# Patient Record
Sex: Female | Born: 1990 | Hispanic: Yes | Marital: Married | Smoking: Former smoker
Health system: Southern US, Community
[De-identification: ages and names within clinical notes are randomized; demographics above are authoritative.]

## PROBLEM LIST (undated history)

## (undated) DIAGNOSIS — Z789 Other specified health status: Secondary | ICD-10-CM

## (undated) DIAGNOSIS — O24419 Gestational diabetes mellitus in pregnancy, unspecified control: Secondary | ICD-10-CM

## (undated) DIAGNOSIS — O24410 Gestational diabetes mellitus in pregnancy, diet controlled: Secondary | ICD-10-CM

## (undated) HISTORY — DX: Gestational diabetes mellitus in pregnancy, unspecified control: O24.419

## (undated) HISTORY — DX: Gestational diabetes mellitus in pregnancy, diet controlled: O24.410

---
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
## 2011-01-02 ENCOUNTER — Other Ambulatory Visit (HOSPITAL_COMMUNITY): Payer: Self-pay | Admitting: Obstetrics

## 2011-01-02 DIAGNOSIS — Z3689 Encounter for other specified antenatal screening: Secondary | ICD-10-CM

## 2011-01-07 ENCOUNTER — Ambulatory Visit (HOSPITAL_COMMUNITY)
Admission: RE | Admit: 2011-01-07 | Discharge: 2011-01-07 | Disposition: A | Discharge status: Home / Self Care | Payer: Medicaid Other | Source: Ambulatory Visit | Attending: Obstetrics | Admitting: Obstetrics

## 2011-01-07 DIAGNOSIS — O358XX0 Maternal care for other (suspected) fetal abnormality and damage, not applicable or unspecified: Secondary | ICD-10-CM | POA: Insufficient documentation

## 2011-01-07 DIAGNOSIS — Z3689 Encounter for other specified antenatal screening: Secondary | ICD-10-CM

## 2011-01-07 DIAGNOSIS — Z363 Encounter for antenatal screening for malformations: Secondary | ICD-10-CM | POA: Insufficient documentation

## 2011-01-07 DIAGNOSIS — Z1389 Encounter for screening for other disorder: Secondary | ICD-10-CM | POA: Insufficient documentation

## 2011-04-27 ENCOUNTER — Encounter (HOSPITAL_COMMUNITY): Payer: Self-pay | Admitting: *Deleted

## 2011-04-27 ENCOUNTER — Inpatient Hospital Stay (HOSPITAL_COMMUNITY)
Admission: AD | Admit: 2011-04-27 | Discharge: 2011-05-02 | DRG: 765 | Disposition: A | Discharge status: Home / Self Care | Payer: Medicaid Other | Source: Ambulatory Visit | Attending: Obstetrics | Admitting: Obstetrics

## 2011-04-27 DIAGNOSIS — IMO0002 Reserved for concepts with insufficient information to code with codable children: Secondary | ICD-10-CM | POA: Diagnosis not present

## 2011-04-27 DIAGNOSIS — O139 Gestational [pregnancy-induced] hypertension without significant proteinuria, unspecified trimester: Secondary | ICD-10-CM | POA: Diagnosis present

## 2011-04-27 DIAGNOSIS — O324XX0 Maternal care for high head at term, not applicable or unspecified: Secondary | ICD-10-CM | POA: Diagnosis present

## 2011-04-27 DIAGNOSIS — O1410 Severe pre-eclampsia, unspecified trimester: Secondary | ICD-10-CM | POA: Diagnosis present

## 2011-04-27 LAB — COMPREHENSIVE METABOLIC PANEL
ALT: 10 U/L (ref 0–35)
Albumin: 2.2 g/dL — ABNORMAL LOW (ref 3.5–5.2)
Alkaline Phosphatase: 134 U/L — ABNORMAL HIGH (ref 39–117)
Glucose, Bld: 102 mg/dL — ABNORMAL HIGH (ref 70–99)
Potassium: 4.4 mEq/L (ref 3.5–5.1)
Sodium: 136 mEq/L (ref 135–145)
Total Protein: 5.8 g/dL — ABNORMAL LOW (ref 6.0–8.3)

## 2011-04-27 LAB — CBC
HCT: 32.8 % — ABNORMAL LOW (ref 36.0–46.0)
Platelets: 220 10*3/uL (ref 150–400)
RDW: 14.7 % (ref 11.5–15.5)
WBC: 10 10*3/uL (ref 4.0–10.5)

## 2011-04-27 MED ORDER — MAGNESIUM SULFATE BOLUS VIA INFUSION
4.0000 g | Freq: Once | INTRAVENOUS | Status: AC
  Administered 2011-04-27: 4 g via INTRAVENOUS
  Filled 2011-04-27: qty 500

## 2011-04-27 MED ORDER — PHENYLEPHRINE 40 MCG/ML (10ML) SYRINGE FOR IV PUSH (FOR BLOOD PRESSURE SUPPORT)
80.0000 ug | PREFILLED_SYRINGE | INTRAVENOUS | Status: DC | PRN
  Filled 2011-04-27: qty 5

## 2011-04-27 MED ORDER — OXYTOCIN 20 UNITS IN LACTATED RINGERS INFUSION - SIMPLE
1.0000 m[IU]/min | INTRAVENOUS | Status: DC
  Administered 2011-04-28: 2 m[IU]/min via INTRAVENOUS
  Administered 2011-04-29: 20 [IU] via INTRAVENOUS
  Administered 2011-04-29: 20 m[IU]/min via INTRAVENOUS
  Filled 2011-04-27 (×2): qty 1000

## 2011-04-27 MED ORDER — MISOPROSTOL 25 MCG QUARTER TABLET
25.0000 ug | ORAL_TABLET | ORAL | Status: DC | PRN
  Administered 2011-04-27 – 2011-04-28 (×3): 25 ug via VAGINAL
  Filled 2011-04-27 (×4): qty 0.25

## 2011-04-27 MED ORDER — ACETAMINOPHEN 325 MG PO TABS: 650.0000 mg | ORAL_TABLET | ORAL | Status: DC | PRN

## 2011-04-27 MED ORDER — LACTATED RINGERS IV SOLN
INTRAVENOUS | Status: DC
  Administered 2011-04-27 – 2011-04-29 (×4): via INTRAVENOUS

## 2011-04-27 MED ORDER — FENTANYL 2.5 MCG/ML BUPIVACAINE 1/10 % EPIDURAL INFUSION (WH - ANES)
14.0000 mL/h | INTRAMUSCULAR | Status: DC
  Administered 2011-04-29 (×3): 14 mL/h via EPIDURAL
  Filled 2011-04-27 (×4): qty 60

## 2011-04-27 MED ORDER — EPHEDRINE 5 MG/ML INJ: 10.0000 mg | INTRAVENOUS | Status: DC | PRN

## 2011-04-27 MED ORDER — PHENYLEPHRINE 40 MCG/ML (10ML) SYRINGE FOR IV PUSH (FOR BLOOD PRESSURE SUPPORT): 80.0000 ug | PREFILLED_SYRINGE | INTRAVENOUS | Status: DC | PRN

## 2011-04-27 MED ORDER — OXYCODONE-ACETAMINOPHEN 5-325 MG PO TABS: 2.0000 | ORAL_TABLET | ORAL | Status: DC | PRN

## 2011-04-27 MED ORDER — CITRIC ACID-SODIUM CITRATE 334-500 MG/5ML PO SOLN
30.0000 mL | ORAL | Status: DC | PRN
  Administered 2011-04-29: 30 mL via ORAL
  Filled 2011-04-27: qty 15

## 2011-04-27 MED ORDER — OXYTOCIN 20 UNITS IN LACTATED RINGERS INFUSION - SIMPLE: 125.0000 mL/h | Freq: Once | INTRAVENOUS | Status: DC

## 2011-04-27 MED ORDER — LACTATED RINGERS IV SOLN
500.0000 mL | INTRAVENOUS | Status: DC | PRN
  Administered 2011-04-28: 500 mL via INTRAVENOUS

## 2011-04-27 MED ORDER — IBUPROFEN 600 MG PO TABS: 600.0000 mg | ORAL_TABLET | Freq: Four times a day (QID) | ORAL | Status: DC | PRN

## 2011-04-27 MED ORDER — EPHEDRINE 5 MG/ML INJ
10.0000 mg | INTRAVENOUS | Status: DC | PRN
  Filled 2011-04-27: qty 4

## 2011-04-27 MED ORDER — TERBUTALINE SULFATE 1 MG/ML IJ SOLN: 0.2500 mg | Freq: Once | INTRAMUSCULAR | Status: AC | PRN

## 2011-04-27 MED ORDER — LACTATED RINGERS IV SOLN
500.0000 mL | Freq: Once | INTRAVENOUS | Status: AC
  Administered 2011-04-29 (×3): via INTRAVENOUS
  Administered 2011-04-29: 500 mL via INTRAVENOUS

## 2011-04-27 MED ORDER — DIPHENHYDRAMINE HCL 50 MG/ML IJ SOLN: 12.5000 mg | INTRAMUSCULAR | Status: DC | PRN

## 2011-04-27 MED ORDER — FLEET ENEMA 7-19 GM/118ML RE ENEM: 1.0000 | ENEMA | RECTAL | Status: DC | PRN

## 2011-04-27 MED ORDER — ONDANSETRON HCL 4 MG/2ML IJ SOLN: 4.0000 mg | Freq: Four times a day (QID) | INTRAMUSCULAR | Status: DC | PRN

## 2011-04-27 MED ORDER — MAGNESIUM SULFATE 40 G IN LACTATED RINGERS - SIMPLE
2.0000 g/h | INTRAVENOUS | Status: DC
  Administered 2011-04-28 – 2011-04-29 (×2): 2 g/h via INTRAVENOUS
  Filled 2011-04-27 (×3): qty 500

## 2011-04-27 MED ORDER — LIDOCAINE HCL (PF) 1 % IJ SOLN: 30.0000 mL | INTRAMUSCULAR | Status: DC | PRN

## 2011-04-27 MED ORDER — OXYTOCIN BOLUS FROM INFUSION
500.0000 mL | Freq: Once | INTRAVENOUS | Status: DC
  Filled 2011-04-27: qty 500

## 2011-04-27 NOTE — H&P (Signed)
This is Dr. Francoise Ceo dictating the preop the admitting history and physical on  Meredith [name] she's 20 year old gravida 1 EDC 12 04/27/2011 she is [redacted] weeks pregnant GBS negative and she was seen in the office today the blood pressure 100 him a 40/90-3+ proteinuria and a him a pulmonary in in one week cervix 1 cm long vertex -3 station she would was admitted for [facility] for delivery PIH labs done CBC and she was started on magnesium sulfate 4 g loading total ML and then Cytotec inserted every 4 hours x3 Past medical history negative Past surgical history negative Social history negative Physical exam well-developed female in no distress HEENT negative Breasts negative Heart regular rhythm no murmurs no gallops Abdomen term Pelvic as described above Extremities negative

## 2011-04-28 LAB — CBC
MCHC: 32.9 g/dL (ref 30.0–36.0)
RDW: 14.6 % (ref 11.5–15.5)

## 2011-04-28 NOTE — Progress Notes (Signed)
   blood pressure 150 1/95 She got Cytotec x3 last night and her cervix is now 2 cm 90% and the vertex at a -3 station amniotomy was performed the fluids clear an IUPC inserted she's now on low-dose Pitocin and having irregular contractions

## 2011-04-28 NOTE — Progress Notes (Signed)
Cervix is also centimeters and the vertex is at a 0 station patient plans to get her epidural tracing is reactive continue him present therapy

## 2011-04-28 NOTE — Progress Notes (Signed)
Cervix is now 5 cm 90% vertex -2 station contracting every 1-4 minutes on 12 milliunits of Pitocin tracing is reactive continue present therapy

## 2011-04-29 ENCOUNTER — Inpatient Hospital Stay (HOSPITAL_COMMUNITY): Payer: Medicaid Other | Admitting: Anesthesiology

## 2011-04-29 ENCOUNTER — Encounter (HOSPITAL_COMMUNITY): Payer: Self-pay | Admitting: Anesthesiology

## 2011-04-29 ENCOUNTER — Encounter (HOSPITAL_COMMUNITY): Payer: Self-pay | Admitting: *Deleted

## 2011-04-29 ENCOUNTER — Other Ambulatory Visit: Payer: Self-pay | Admitting: Obstetrics & Gynecology

## 2011-04-29 ENCOUNTER — Encounter (HOSPITAL_COMMUNITY)
Admission: AD | Disposition: A | Discharge status: Home / Self Care | Payer: Self-pay | Source: Ambulatory Visit | Attending: Obstetrics

## 2011-04-29 DIAGNOSIS — O1410 Severe pre-eclampsia, unspecified trimester: Secondary | ICD-10-CM | POA: Diagnosis present

## 2011-04-29 LAB — CBC
HCT: 31.7 % — ABNORMAL LOW (ref 36.0–46.0)
MCH: 26.9 pg (ref 26.0–34.0)
MCV: 82.1 fL (ref 78.0–100.0)
RDW: 14.8 % (ref 11.5–15.5)
WBC: 38.8 10*3/uL — ABNORMAL HIGH (ref 4.0–10.5)

## 2011-04-29 SURGERY — Surgical Case
Anesthesia: Epidural | Site: Uterus | Wound class: Clean Contaminated

## 2011-04-29 MED ORDER — MEASLES, MUMPS & RUBELLA VAC ~~LOC~~ INJ
0.5000 mL | INJECTION | Freq: Once | SUBCUTANEOUS | Status: AC
  Administered 2011-05-02: 0.5 mL via SUBCUTANEOUS
  Filled 2011-04-29 (×2): qty 0.5

## 2011-04-29 MED ORDER — LIDOCAINE-EPINEPHRINE (PF) 2 %-1:200000 IJ SOLN
INTRAMUSCULAR | Status: AC
Start: 2011-04-29 — End: 2011-04-29
  Filled 2011-04-29: qty 20

## 2011-04-29 MED ORDER — ZOLPIDEM TARTRATE 5 MG PO TABS: 5.0000 mg | ORAL_TABLET | Freq: Every evening | ORAL | Status: DC | PRN

## 2011-04-29 MED ORDER — TETANUS-DIPHTH-ACELL PERTUSSIS 5-2.5-18.5 LF-MCG/0.5 IM SUSP
0.5000 mL | Freq: Once | INTRAMUSCULAR | Status: DC
  Filled 2011-04-29: qty 0.5

## 2011-04-29 MED ORDER — LANOLIN HYDROUS EX OINT: 1.0000 "application " | TOPICAL_OINTMENT | CUTANEOUS | Status: DC | PRN

## 2011-04-29 MED ORDER — FENTANYL CITRATE 0.05 MG/ML IJ SOLN
INTRAMUSCULAR | Status: AC
  Filled 2011-04-29: qty 2

## 2011-04-29 MED ORDER — MAGNESIUM HYDROXIDE 400 MG/5ML PO SUSP: 30.0000 mL | ORAL | Status: DC | PRN

## 2011-04-29 MED ORDER — ONDANSETRON HCL 4 MG/2ML IJ SOLN
INTRAMUSCULAR | Status: DC | PRN
  Administered 2011-04-29: 4 mg via INTRAVENOUS

## 2011-04-29 MED ORDER — ONDANSETRON HCL 4 MG/2ML IJ SOLN
INTRAMUSCULAR | Status: AC
  Filled 2011-04-29: qty 2

## 2011-04-29 MED ORDER — DIBUCAINE 1 % RE OINT
1.0000 "application " | TOPICAL_OINTMENT | RECTAL | Status: DC | PRN
  Filled 2011-04-29: qty 28

## 2011-04-29 MED ORDER — SIMETHICONE 80 MG PO CHEW
80.0000 mg | CHEWABLE_TABLET | ORAL | Status: DC | PRN
  Administered 2011-04-30 – 2011-05-02 (×6): 80 mg via ORAL

## 2011-04-29 MED ORDER — OXYCODONE-ACETAMINOPHEN 5-325 MG PO TABS
1.0000 | ORAL_TABLET | ORAL | Status: DC | PRN
  Administered 2011-04-30 – 2011-05-01 (×3): 1 via ORAL
  Filled 2011-04-29: qty 2
  Filled 2011-04-29 (×3): qty 1

## 2011-04-29 MED ORDER — OXYTOCIN 20 UNITS IN LACTATED RINGERS INFUSION - SIMPLE
INTRAVENOUS | Status: AC
  Administered 2011-04-29: 20 [IU] via INTRAVENOUS
  Filled 2011-04-29: qty 1000

## 2011-04-29 MED ORDER — OXYTOCIN 20 UNITS IN LACTATED RINGERS INFUSION - SIMPLE
INTRAVENOUS | Status: DC | PRN
  Administered 2011-04-29: 20 [IU] via INTRAVENOUS

## 2011-04-29 MED ORDER — MEDROXYPROGESTERONE ACETATE 150 MG/ML IM SUSP: 150.0000 mg | INTRAMUSCULAR | Status: DC | PRN

## 2011-04-29 MED ORDER — ONDANSETRON HCL 4 MG/2ML IJ SOLN
4.0000 mg | INTRAMUSCULAR | Status: DC | PRN
  Administered 2011-04-29: 4 mg via INTRAVENOUS
  Filled 2011-04-29: qty 2

## 2011-04-29 MED ORDER — FENTANYL 2.5 MCG/ML BUPIVACAINE 1/10 % EPIDURAL INFUSION (WH - ANES)
INTRAMUSCULAR | Status: DC | PRN
  Administered 2011-04-29: 14 mL/h via EPIDURAL

## 2011-04-29 MED ORDER — FERROUS SULFATE 325 (65 FE) MG PO TABS
325.0000 mg | ORAL_TABLET | Freq: Two times a day (BID) | ORAL | Status: DC
  Administered 2011-04-30 – 2011-05-02 (×5): 325 mg via ORAL
  Filled 2011-04-29 (×6): qty 1

## 2011-04-29 MED ORDER — FENTANYL CITRATE 0.05 MG/ML IJ SOLN: 25.0000 ug | INTRAMUSCULAR | Status: DC | PRN

## 2011-04-29 MED ORDER — WITCH HAZEL-GLYCERIN EX PADS: 1.0000 "application " | MEDICATED_PAD | CUTANEOUS | Status: DC | PRN

## 2011-04-29 MED ORDER — CEFAZOLIN SODIUM-DEXTROSE 2-3 GM-% IV SOLR
2.0000 g | INTRAVENOUS | Status: AC
  Administered 2011-04-29: 2 g via INTRAVENOUS
  Filled 2011-04-29: qty 50

## 2011-04-29 MED ORDER — MORPHINE SULFATE 0.5 MG/ML IJ SOLN
INTRAMUSCULAR | Status: AC
  Filled 2011-04-29: qty 10

## 2011-04-29 MED ORDER — ONDANSETRON HCL 4 MG PO TABS: 4.0000 mg | ORAL_TABLET | ORAL | Status: DC | PRN

## 2011-04-29 MED ORDER — MORPHINE SULFATE (PF) 0.5 MG/ML IJ SOLN
INTRAMUSCULAR | Status: DC | PRN
  Administered 2011-04-29: 1 ug via INTRAVENOUS
  Administered 2011-04-29 (×2): .5 ug via EPIDURAL

## 2011-04-29 MED ORDER — LIDOCAINE HCL 1.5 % IJ SOLN
INTRAMUSCULAR | Status: DC | PRN
  Administered 2011-04-29 (×2): 5 mL via EPIDURAL

## 2011-04-29 MED ORDER — ACETAMINOPHEN 500 MG PO TABS
1000.0000 mg | ORAL_TABLET | Freq: Once | ORAL | Status: AC
  Administered 2011-04-29: 1000 mg via ORAL

## 2011-04-29 MED ORDER — SENNOSIDES-DOCUSATE SODIUM 8.6-50 MG PO TABS
2.0000 | ORAL_TABLET | Freq: Every day | ORAL | Status: DC
  Administered 2011-04-30: 1 via ORAL
  Administered 2011-05-01: 2 via ORAL

## 2011-04-29 MED ORDER — SODIUM BICARBONATE 8.4 % IV SOLN
INTRAVENOUS | Status: AC
Start: 2011-04-29 — End: 2011-04-29
  Filled 2011-04-29: qty 50

## 2011-04-29 MED ORDER — SODIUM BICARBONATE 8.4 % IV SOLN
INTRAVENOUS | Status: DC | PRN
  Administered 2011-04-29: 5 mL via EPIDURAL

## 2011-04-29 MED ORDER — PRENATAL PLUS 27-1 MG PO TABS
1.0000 | ORAL_TABLET | Freq: Every day | ORAL | Status: DC
  Administered 2011-04-30 – 2011-05-02 (×3): 1 via ORAL
  Filled 2011-04-29 (×2): qty 1

## 2011-04-29 MED ORDER — IBUPROFEN 600 MG PO TABS
600.0000 mg | ORAL_TABLET | Freq: Four times a day (QID) | ORAL | Status: DC
  Administered 2011-04-30 – 2011-05-02 (×10): 600 mg via ORAL
  Filled 2011-04-29 (×5): qty 1

## 2011-04-29 MED ORDER — LACTATED RINGERS IV SOLN
INTRAVENOUS | Status: DC
  Administered 2011-04-29 – 2011-04-30 (×2): 125 mL/h via INTRAVENOUS

## 2011-04-29 MED ORDER — DIPHENHYDRAMINE HCL 25 MG PO CAPS: 25.0000 mg | ORAL_CAPSULE | Freq: Four times a day (QID) | ORAL | Status: DC | PRN

## 2011-04-29 MED ORDER — MORPHINE SULFATE (PF) 0.5 MG/ML IJ SOLN
INTRAMUSCULAR | Status: DC | PRN
  Administered 2011-04-29: 3 ug via EPIDURAL

## 2011-04-29 MED ORDER — LACTATED RINGERS IV SOLN
INTRAVENOUS | Status: DC | PRN
  Administered 2011-04-29: 12:00:00 via INTRAVENOUS

## 2011-04-29 SURGICAL SUPPLY — 31 items
BENZOIN TINCTURE PRP APPL 2/3 (GAUZE/BANDAGES/DRESSINGS) ×2 IMPLANT
CHLORAPREP W/TINT 26ML (MISCELLANEOUS) ×2 IMPLANT
CLOTH BEACON ORANGE TIMEOUT ST (SAFETY) ×2 IMPLANT
DRESSING TELFA 8X3 (GAUZE/BANDAGES/DRESSINGS) ×2 IMPLANT
ELECT REM PT RETURN 9FT ADLT (ELECTROSURGICAL) ×2
ELECTRODE REM PT RTRN 9FT ADLT (ELECTROSURGICAL) ×1 IMPLANT
GAUZE SPONGE 4X4 12PLY STRL LF (GAUZE/BANDAGES/DRESSINGS) ×4 IMPLANT
GLOVE BIO SURGEON STRL SZ 6.5 (GLOVE) ×4 IMPLANT
GOWN PREVENTION PLUS LG XLONG (DISPOSABLE) ×6 IMPLANT
NS IRRIG 1000ML POUR BTL (IV SOLUTION) ×2 IMPLANT
PACK C SECTION WH (CUSTOM PROCEDURE TRAY) ×2 IMPLANT
PAD ABD 7.5X8 STRL (GAUZE/BANDAGES/DRESSINGS) ×2 IMPLANT
SLEEVE SCD COMPRESS KNEE LRG (MISCELLANEOUS) IMPLANT
SLEEVE SCD COMPRESS KNEE MED (MISCELLANEOUS) ×2 IMPLANT
STAPLER VISISTAT 35W (STAPLE) IMPLANT
STRIP CLOSURE SKIN 1/2X4 (GAUZE/BANDAGES/DRESSINGS) ×2 IMPLANT
SUT MNCRL 0 VIOLET CTX 36 (SUTURE) ×2 IMPLANT
SUT MNCRL AB 3-0 PS2 27 (SUTURE) ×2 IMPLANT
SUT MONOCRYL 0 CTX 36 (SUTURE) ×2
SUT PDS AB 0 CTX 36 PDP370T (SUTURE) ×2 IMPLANT
SUT PLAIN 0 NONE (SUTURE) IMPLANT
SUT VIC AB 0 CT1 27 (SUTURE) ×2
SUT VIC AB 0 CT1 27XBRD ANBCTR (SUTURE) ×2 IMPLANT
SUT VIC AB 2-0 CT1 (SUTURE) IMPLANT
SUT VIC AB 2-0 CT1 27 (SUTURE) ×1
SUT VIC AB 2-0 CT1 TAPERPNT 27 (SUTURE) ×1 IMPLANT
SUT VIC AB 2-0 SH 27 (SUTURE)
SUT VIC AB 2-0 SH 27XBRD (SUTURE) IMPLANT
TOWEL OR 17X24 6PK STRL BLUE (TOWEL DISPOSABLE) ×4 IMPLANT
TRAY FOLEY CATH 14FR (SET/KITS/TRAYS/PACK) ×2 IMPLANT
WATER STERILE IRR 1000ML POUR (IV SOLUTION) ×2 IMPLANT

## 2011-04-29 NOTE — Progress Notes (Signed)
Late note - Foley removed at 0950 with 150 ml of yellow urine.

## 2011-04-29 NOTE — Progress Notes (Signed)
UR chart review completed.  

## 2011-04-29 NOTE — Progress Notes (Signed)
RN remains at the OR table - anesthesia testing for numbness.

## 2011-04-29 NOTE — Anesthesia Postprocedure Evaluation (Signed)
Anesthesia Post Note  Patient: Meredith [name]  Procedure(s) Performed:  CESAREAN SECTION - Primary Cesarian Section  Anesthesia type: Epidural  Patient location: PACU  Post pain: Pain level controlled  Post assessment: Post-op Vital signs reviewed  Last Vitals:  Filed Vitals:   04/29/11 1230  BP: 132/63  Pulse: 98  Temp: 36.7 C  Resp: 28    Post vital signs: stable  Level of consciousness: awake  Complications: No apparent anesthesia complications

## 2011-04-29 NOTE — Transfer of Care (Signed)
Immediate Anesthesia Transfer of Care Note  Patient: Meredith [name]  Procedure(s) Performed:  CESAREAN SECTION - Primary Cesarian Section  Patient Location: PACU  Anesthesia Type: Epidural  Level of Consciousness: awake and alert   Airway & Oxygen Therapy: Patient Spontanous Breathing  Post-op Assessment: Report given to PACU RN and Post -op Vital signs reviewed and stable  Post vital signs: Reviewed and stable  Complications: No apparent anesthesia complications

## 2011-04-29 NOTE — Progress Notes (Signed)
FHR check - pt. On OR table - RN at the bedside to maintain continuous fetal tracing.

## 2011-04-29 NOTE — Progress Notes (Signed)
Attempted to push x 2 uterine contractions. T. Does not feel urge to push or pressure. Will allow to labor down

## 2011-04-29 NOTE — Progress Notes (Addendum)
Meredith [name] is [name] 20 y.o. G1P0 at [redacted]w[redacted]d by LMP admitted for IOL  Subjective: Uncomfortable  Objective: BP 144/70  Pulse 117  Temp(Src) 99.6 F (37.6 C) (Axillary)  Resp 20  Ht 5\' 3"  (1.6 m)  Wt 109.317 kg (241 lb)  BMI 42.69 kg/m2  SpO2 99%  LMP 07/23/2010 I/O last 3 completed shifts: In: 7100.9 [P.O.:1450; I.V.:5650.9] Out: 2775 [Urine:2775]    FHT:  FHR: 150 bpm, variability: moderate,  accelerations:  Present,  decelerations:  Absent UC:   irregular, every 4 minutes SVE:   Dilation: 10 Effacement (%): 100 Station: +1 Exam by:: Dr. Tamela [name]  Labs: Lab Results  Component Value Date   WBC 22.9* 04/28/2011   HGB 11.8* 04/28/2011   HCT 35.9* 04/28/2011   MCV 81.4 04/28/2011   PLT 214 04/28/2011    Assessment / Plan: Arrest of decent, Persistent OP likely  Labor: See above Preeclampsia:  n/[name] Fetal Wellbeing:  Category I Pain Control:  Epidural I/D:  Febrile--no other signs/symptoms of chorioamnionitis Anticipated MOD:  Cesarean delivery  Meredith [name],Meredith [name] 04/29/2011, 10:52 AM  Pt is undergoing IOL secondary to preeclampsia.  BPs  Stable.

## 2011-04-29 NOTE — Progress Notes (Addendum)
Consent for C/S discussed and signed.  C/S called.

## 2011-04-29 NOTE — Progress Notes (Signed)
Start pushing - after an hour call and give update.

## 2011-04-29 NOTE — Progress Notes (Signed)
During a contraction.

## 2011-04-29 NOTE — Anesthesia Preprocedure Evaluation (Addendum)
Anesthesia Evaluation  Patient identified by MRN, date of birth, ID band Patient awake    Reviewed: Allergy & Precautions, H&P , NPO status , Patient's Chart, lab work & pertinent test results  Airway Mallampati: III TM Distance: >3 FB Neck ROM: full    Dental No notable dental hx.    Pulmonary neg pulmonary ROS,    Pulmonary exam normal       Cardiovascular neg cardio ROS     Neuro/Psych Negative Neurological ROS  Negative Psych ROS   GI/Hepatic negative GI ROS, Neg liver ROS,   Endo/Other  Morbid obesity  Renal/GU negative Renal ROS  Genitourinary negative   Musculoskeletal negative musculoskeletal ROS (+)   Abdominal (+) obese,   Peds negative pediatric ROS (+)  Hematology negative hematology ROS (+)   Anesthesia Other Findings   Reproductive/Obstetrics (+) Pregnancy                           Anesthesia Physical Anesthesia Plan  ASA: III  Anesthesia Plan: Epidural   Post-op Pain Management:    Induction:   Airway Management Planned:   Additional Equipment:   Intra-op Plan:   Post-operative Plan:   Informed Consent: I have reviewed the patients History and Physical, chart, labs and discussed the procedure including the risks, benefits and alternatives for the proposed anesthesia with the patient or authorized representative who has indicated his/her understanding and acceptance.     Plan Discussed with:   Anesthesia Plan Comments:         Anesthesia Quick Evaluation

## 2011-04-29 NOTE — Progress Notes (Signed)
FHR on the OR table 133 bpm.

## 2011-04-29 NOTE — Op Note (Signed)
Cesarean Section Procedure Note   [name]   04/27/2011 - 04/29/2011  Indications: Dystocia   Pre-operative Diagnosis: Arrest of Descent.   Post-operative Diagnosis: Same   Surgeon: Antionette Char A  Assistants: none  Anesthesia: epidural  Procedure Details:  The patient was seen in the Holding Room. The risks, benefits, complications, treatment options, and expected outcomes were discussed with the patient. The patient concurred with the proposed plan, giving informed consent. The patient was identified as Meredith [name] and the procedure verified as C-Section Delivery. A Time Out was held and the above information confirmed.  After induction of anesthesia, the patient was draped and prepped in the usual sterile manner. A transverse incision was made and carried down through the subcutaneous tissue to the fascia. The fascial incision was made and extended transversely. The fascia was separated from the underlying rectus tissue superiorly and inferiorly. The peritoneum was identified and entered. The peritoneal incision was extended longitudinally. The utero-vesical peritoneal reflection was incised transversely and the bladder flap was bluntly freed from the lower uterine segment. A low transverse uterine incision was made. Delivered from cephalic presentation was a  living newborn female infant. A cord ph was not sent. The umbilical cord was clamped and cut cord. A sample was obtained for evaluation. The placenta was removed Intact and appeared normal.  The uterine incision was closed with running locked sutures of 1-0 Monocryl. A second imbricating layer of the same suture was placed.  Hemostasis was observed. The paracolic gutters were irrigated. The fascia was then reapproximated with running sutures of 1-0Vicryl. The subcuticular closure was performed using 3-0 Monocryl.  Instrument, sponge, and needle counts were correct prior the abdominal closure and were correct at the  conclusion of the case.    Findings:  Edema of subcutaneous tissues, LUS   Estimated Blood Loss: 800 ml  Total IV Fluids:  per Anesthesiology  Urine Output: per Anesthesiology  Specimens: to Pathology   Complications: no complications  Disposition: PACU - hemodynamically stable.  Maternal Condition: stable   Baby condition / location:  nursery-stable    Signed: Surgeon(s): Roseanna Rainbow, MD

## 2011-04-29 NOTE — Anesthesia Procedure Notes (Signed)
Epidural Patient location during procedure: OB Start time: 04/29/2011 12:31 AM End time: 04/29/2011 12:37 AM Reason for block: procedure for pain  Staffing Anesthesiologist: Sandrea Hughs Performed by: anesthesiologist   Preanesthetic Checklist Completed: patient identified, site marked, surgical consent, pre-op evaluation, timeout performed, IV checked, risks and benefits discussed and monitors and equipment checked  Epidural Patient position: sitting Prep: site prepped and draped and DuraPrep Patient monitoring: continuous pulse ox and blood pressure Approach: midline Injection technique: LOR air  Needle:  Needle type: Tuohy  Needle gauge: 17 G Needle length: 9 cm Needle insertion depth: 7 cm Catheter type: closed end flexible Catheter size: 19 Gauge Catheter at skin depth: 12 cm Test dose: negative and 1.5% lidocaine  Assessment Sensory level: T9 Events: blood not aspirated, injection not painful, no injection resistance, negative IV test and no paresthesia

## 2011-04-30 MED ORDER — SCOPOLAMINE 1 MG/3DAYS TD PT72
1.0000 | MEDICATED_PATCH | Freq: Once | TRANSDERMAL | Status: DC
  Filled 2011-04-30: qty 1

## 2011-04-30 MED ORDER — NALOXONE HCL 0.4 MG/ML IJ SOLN: 1.0000 ug/kg/h | INTRAMUSCULAR | Status: DC | PRN

## 2011-04-30 MED ORDER — KETOROLAC TROMETHAMINE 60 MG/2ML IM SOLN: 60.0000 mg | Freq: Once | INTRAMUSCULAR | Status: AC | PRN

## 2011-04-30 MED ORDER — MEPERIDINE HCL 25 MG/ML IJ SOLN: 6.2500 mg | INTRAMUSCULAR | Status: DC | PRN

## 2011-04-30 MED ORDER — KETOROLAC TROMETHAMINE 30 MG/ML IJ SOLN: 30.0000 mg | Freq: Four times a day (QID) | INTRAMUSCULAR | Status: AC | PRN

## 2011-04-30 MED ORDER — NALOXONE HCL 0.4 MG/ML IJ SOLN: 0.4000 mg | INTRAMUSCULAR | Status: DC | PRN

## 2011-04-30 MED ORDER — DIPHENHYDRAMINE HCL 50 MG/ML IJ SOLN: 25.0000 mg | INTRAMUSCULAR | Status: DC | PRN

## 2011-04-30 MED ORDER — DIPHENHYDRAMINE HCL 50 MG/ML IJ SOLN: 12.5000 mg | INTRAMUSCULAR | Status: DC | PRN

## 2011-04-30 MED ORDER — ONDANSETRON HCL 4 MG/2ML IJ SOLN: 4.0000 mg | Freq: Three times a day (TID) | INTRAMUSCULAR | Status: DC | PRN

## 2011-04-30 MED ORDER — SODIUM CHLORIDE 0.9 % IJ SOLN: 3.0000 mL | INTRAMUSCULAR | Status: DC | PRN

## 2011-04-30 MED ORDER — DIPHENHYDRAMINE HCL 25 MG PO CAPS: 25.0000 mg | ORAL_CAPSULE | ORAL | Status: DC | PRN

## 2011-04-30 MED ORDER — SODIUM CHLORIDE 0.9 % IJ SOLN
3.0000 mL | Freq: Two times a day (BID) | INTRAMUSCULAR | Status: DC
  Administered 2011-04-30: 3 mL via INTRAVENOUS

## 2011-04-30 MED ORDER — IBUPROFEN 600 MG PO TABS
600.0000 mg | ORAL_TABLET | Freq: Four times a day (QID) | ORAL | Status: DC | PRN
  Filled 2011-04-30 (×6): qty 1

## 2011-04-30 MED ORDER — NALBUPHINE HCL 10 MG/ML IJ SOLN: 5.0000 mg | INTRAMUSCULAR | Status: DC | PRN

## 2011-04-30 MED ORDER — METOCLOPRAMIDE HCL 5 MG/ML IJ SOLN: 10.0000 mg | Freq: Three times a day (TID) | INTRAMUSCULAR | Status: DC | PRN

## 2011-04-30 MED ORDER — INFLUENZA VIRUS VACC SPLIT PF IM SUSP
0.5000 mL | INTRAMUSCULAR | Status: AC
  Filled 2011-04-30: qty 0.5

## 2011-04-30 NOTE — Addendum Note (Signed)
Addendum  created 04/30/11 1610 by Suella Grove   Modules edited:Notes Section

## 2011-04-30 NOTE — Progress Notes (Signed)
  Subjective: POD# 1 s/p Cesarean Delivery.  Indications: failure to progress  RH status/Rubella reviewed. Feeding: unknown Patient reports tolerating PO.  Denies HA/SOB/C/P/N/V/dizziness.  Reports flatus or BM. Breast symptoms: no.  She reports vaginal bleeding as normal, without clots.  She is ambulating, urinating without difficulty.     Objective: Vital signs in last 24 hours: BP 125/76  Pulse 103  Temp(Src) 99 F (37.2 C) (Oral)  Resp 20  Ht 5\' 3"  (1.6 m)  Wt 109.317 kg (241 lb)  BMI 42.69 kg/m2  SpO2 95%  LMP 07/23/2010  Breastfeeding? Unknown   Total I/O In: 1110 [P.O.:360; I.V.:750] Out: 450 [Urine:450]   Physical Exam:  General: alert CV: Regular rate and rhythm Resp: clear Abdomen: soft, nontender, normal bowel sounds Lochia: minimal Uterine Fundus: firm, below umbilicus, nontender Incision: dressing C/D Ext: extremities normal, atraumatic, no cyanosis or edema    Basename 04/30/11 0510 04/29/11 1427 04/28/11 2335  HGB 8.7* 10.4* --  HCT -- 31.7* 35.9*      Assessment/Plan: 20 y.o.  status post Cesarean section. POD# 1.   Doing well, stable.   PIH stable            Advance diet as tolerated Start po pain meds D/C foley  HLIV  Ambulate IS D/C MgSO4 Routine post-op care  JACKSON-MOORE,Chidera Dearcos A 04/30/2011, 12:50 PM

## 2011-04-30 NOTE — Anesthesia Postprocedure Evaluation (Signed)
  Anesthesia Post-op Note  Patient: Meredith [name]  Procedure(s) Performed:  CESAREAN SECTION - Primary Cesarian Section  Patient Location: Mother/Baby  Anesthesia Type: Epidural  Level of Consciousness: awake, alert  and oriented  Airway and Oxygen Therapy: Patient Spontanous Breathing  Post-op Pain: none  Post-op Assessment: Post-op Vital signs reviewed and Patient's Cardiovascular Status Stable  Post-op Vital Signs: Reviewed and stable  Complications: No apparent anesthesia complications

## 2011-05-01 LAB — CBC
Hemoglobin: 7.8 g/dL — ABNORMAL LOW (ref 12.0–15.0)
MCH: 27 pg (ref 26.0–34.0)
MCV: 83.4 fL (ref 78.0–100.0)
Platelets: 181 10*3/uL (ref 150–400)
RBC: 2.89 MIL/uL — ABNORMAL LOW (ref 3.87–5.11)

## 2011-05-01 MED ORDER — TETANUS-DIPHTH-ACELL PERTUSSIS 5-2.5-18.5 LF-MCG/0.5 IM SUSP
0.5000 mL | Freq: Once | INTRAMUSCULAR | Status: AC
  Administered 2011-05-01: 0.5 mL via INTRAMUSCULAR
  Filled 2011-05-01: qty 0.5

## 2011-05-02 DIAGNOSIS — IMO0002 Reserved for concepts with insufficient information to code with codable children: Secondary | ICD-10-CM | POA: Diagnosis not present

## 2011-05-02 MED ORDER — OXYCODONE-ACETAMINOPHEN 5-325 MG PO TABS: 1.0000 | ORAL_TABLET | ORAL | Status: DC | PRN

## 2011-05-02 MED ORDER — IBUPROFEN 600 MG PO TABS: 600.0000 mg | ORAL_TABLET | Freq: Four times a day (QID) | ORAL | Status: DC

## 2011-05-02 MED ORDER — FERROUS SULFATE 325 (65 FE) MG PO TABS: 325.0000 mg | ORAL_TABLET | Freq: Two times a day (BID) | ORAL | Status: DC

## 2011-05-02 NOTE — Discharge Summary (Signed)
Obstetric Discharge Summary Reason for Admission: induction of labor Prenatal Procedures: none Intrapartum Procedures: cesarean: low cervical, transverse Postpartum Procedures: none Complications-Operative and Postpartum: none Hemoglobin  Date Value Range Status  05/01/2011 7.8* 12.0-15.0 (g/dL) Final     HCT  Date Value Range Status  05/01/2011 24.1* 36.0-46.0 (%) Final    Discharge Diagnoses: Term Pregnancy-delivered, Preelampsia and Anemia complicating puerperium  Discharge Information: Date: 05/02/2011 Activity: pelvic rest Diet: routine Medications: PNV, Ibuprofen, Iron and Percocet Condition: stable Instructions: see above Discharge to: home Follow-up Information    Follow up with MARSHALL,BERNARD A, MD. Make an appointment in 2 days.   Contact information:   [address] [place] [address] [phone]          Newborn Data: Live born female  Birth Weight: 7 lb 1.9 oz (3230 g) APGAR: 8, 9  Home with mother.  [name],Meredith Weatherman A 05/02/2011, 1:54 PM

## 2011-05-02 NOTE — Progress Notes (Signed)
Subjective: POD #3 s/p LTC/S  Indication:failure to progress Patient reports tolerating PO.  Denies HA/SOB/C/P/N/V/dizziness.  Reports flatus or BM. Breast symptoms: no  She reports vaginal bleeding as normal, without clots.  She is ambulating, urinating without difficulty.     Objective: Vital signs in last 24 hours: BP 145/94  Pulse 85  Temp(Src) 98.5 F (36.9 C) (Oral)  Resp 18  Ht 5\' 3"  (1.6 m)  Wt 109.317 kg (241 lb)  BMI 42.69 kg/m2  SpO2 97%  LMP 07/23/2010  Breastfeeding? Unknown  Physical Exam:  General: alert CV: Regular rate and rhythm Resp: clear Abdomen: soft, nontender, normal bowel sounds Lochia: minimal Uterine Fundus: firm, below umbilicus, nontender Incision: clean, dry and intact Ext: extremities normal, atraumatic, no cyanosis or edema  Basename 05/01/11 0550 04/30/11 0510 04/29/11 1427  HGB 7.8* 8.7* --  HCT 24.1* -- 31.7*    Assessment/Plan: 21 y.o. status post Cesarean section POD# 3.  normal post-operative exam  Routine post-op care D/C home  JACKSON-MOORE,Gilliam Hawkes A 05/02/2011, 1:48 PM

## 2011-05-03 ENCOUNTER — Encounter (HOSPITAL_COMMUNITY): Payer: Self-pay | Admitting: Obstetrics & Gynecology

## 2011-05-08 ENCOUNTER — Inpatient Hospital Stay (HOSPITAL_COMMUNITY)
Admission: AD | Admit: 2011-05-08 | Discharge: 2011-05-08 | Disposition: A | Discharge status: Home / Self Care | Payer: Medicaid Other | Source: Ambulatory Visit | Attending: Obstetrics | Admitting: Obstetrics

## 2011-05-08 ENCOUNTER — Encounter (HOSPITAL_COMMUNITY): Payer: Self-pay | Admitting: *Deleted

## 2011-05-08 DIAGNOSIS — O909 Complication of the puerperium, unspecified: Secondary | ICD-10-CM | POA: Insufficient documentation

## 2011-05-08 DIAGNOSIS — T8140XA Infection following a procedure, unspecified, initial encounter: Secondary | ICD-10-CM

## 2011-05-08 MED ORDER — CEPHALEXIN 500 MG PO CAPS: 500.0000 mg | ORAL_CAPSULE | Freq: Four times a day (QID) | ORAL | Status: AC

## 2011-05-08 NOTE — ED Provider Notes (Signed)
History   Pt presents today c/o drainage from her incision. She had a C/Section last week and became concerned when she noticed some drainage from the corner of her incision. She denies fever, abd pain, or purulent drainage. She states the drainage has been a clear/yellowish color.  Chief Complaint  Patient presents with  . Wound Check   HPI  OB History    Grav Para Term Preterm Abortions TAB SAB Ect Mult Living   1 1 1       1       No past medical history on file.  Past Surgical History  Procedure Date  . Cesarean section 04/29/2011    Procedure: CESAREAN SECTION;  Surgeon: Roseanna Rainbow, MD;  Location: [facility];  Service: Gynecology;  Laterality: N/A;  Primary Cesarian Section    No family history on file.  History  Substance Use Topics  . Smoking status: Never Smoker   . Smokeless tobacco: Not on file  . Alcohol Use: No    Allergies: No Known Allergies  Prescriptions prior to admission  Medication Sig Dispense Refill  . ferrous sulfate 325 (65 FE) MG tablet Take 1 tablet (325 mg total) by mouth 2 (two) times daily with a meal.  60 tablet  5  . ibuprofen (ADVIL,MOTRIN) 600 MG tablet Take 1 tablet (600 mg total) by mouth every 6 (six) hours.  60 tablet  1  . oxyCODONE-acetaminophen (PERCOCET) 5-325 MG per tablet Take 1-2 tablets by mouth every 3 (three) hours as needed (moderate - severe pain).  30 tablet  0  . prenatal vitamin w/FE, FA (PRENATAL 1 + 1) 27-1 MG TABS Take 1 tablet by mouth daily.          Review of Systems  Constitutional: Negative for fever.  Cardiovascular: Negative for chest pain.  Gastrointestinal: Negative for nausea, vomiting, abdominal pain, diarrhea and constipation.  Genitourinary: Negative for dysuria, urgency, frequency and hematuria.  Neurological: Negative for dizziness and headaches.  Psychiatric/Behavioral: Negative for depression and suicidal ideas.   Physical Exam   Blood pressure 143/80, pulse 112, temperature 100.5 F (38.1  C), temperature source Oral, resp. rate 20, height 5\' 2"  (1.575 m), weight 211 lb 6.4 oz (95.89 kg), last menstrual period 07/23/2010, SpO2 97.00%, unknown if currently breastfeeding.  Physical Exam  Nursing note and vitals reviewed. Constitutional: She is oriented to person, place, and time. She appears well-developed and well-nourished. No distress.  HENT:  Head: Normocephalic and atraumatic.  Eyes: EOM are normal. Pupils are equal, round, and reactive to light.  GI: Soft. She exhibits no distension. There is no tenderness. There is no rebound and no guarding.  Neurological: She is alert and oriented to person, place, and time.  Skin: Skin is warm and dry. She is not diaphoretic.       Abd incision appears to be healing nicely. There is some very mild erythema to panus. No purulent drainage is noted. Drainage appears serosanguinous.   Psychiatric: She has a normal mood and affect. Her behavior is normal. Judgment and thought content normal.    MAU Course  Procedures    Assessment and Plan  Post op: give pt has slightly elevated temp and some mild erythema, will tx prophylactically with keflex. She will f/u with Dr. Gaynell Face next week. Discussed wound care instructions and precautions. Discussed diet, activity, risks, and precautions.  Clinton Gallant. Aaisha Sliter III, DrHSc, MPAS, PA-C  05/08/2011, 11:08 AM   Henrietta Hoover, PA 05/08/11 1114

## 2011-05-08 NOTE — Progress Notes (Signed)
Patient states she had a primary cesarean section on 11-14. States the incision is closed except at both ends where there is a little opening and draining slight bloody discharge.

## 2011-05-10 ENCOUNTER — Encounter (HOSPITAL_COMMUNITY): Payer: Self-pay | Admitting: Family

## 2011-05-10 ENCOUNTER — Inpatient Hospital Stay (HOSPITAL_COMMUNITY)
Admission: AD | Admit: 2011-05-10 | Discharge: 2011-05-10 | Disposition: A | Discharge status: Home / Self Care | Payer: Medicaid Other | Source: Ambulatory Visit | Attending: Obstetrics | Admitting: Obstetrics

## 2011-05-10 DIAGNOSIS — Z09 Encounter for follow-up examination after completed treatment for conditions other than malignant neoplasm: Secondary | ICD-10-CM

## 2011-05-10 DIAGNOSIS — O909 Complication of the puerperium, unspecified: Secondary | ICD-10-CM | POA: Insufficient documentation

## 2011-05-10 HISTORY — DX: Other specified health status: Z78.9

## 2011-05-10 NOTE — Progress Notes (Signed)
Had c/s 11/14. Each end of incision open alittle. Was seen here Friday and given antibiotics. Was told if pus-like drainage started to return. Drainage is thicker and "gooey" from incision. Alittle odor from drainage. Denies fever or chills

## 2011-05-10 NOTE — ED Provider Notes (Signed)
History     Chief Complaint  Patient presents with  . Drainage from Incision   HPI  Pt had a c/s on 11/12 for failure to progress with uncomplicated postpartum course. Seen in MAU on 05/08/11 for incision check and placed on antibiotics.  Here today because incision is open "a little bit" and it is draining a brownish discharge. Denies fever, body aches or chills.     Past Medical History  Diagnosis Date  . No pertinent past medical history     Past Surgical History  Procedure Date  . Cesarean section 04/29/2011    Procedure: CESAREAN SECTION;  Surgeon: Roseanna Rainbow, MD;  Location: [facility];  Service: Gynecology;  Laterality: N/A;  Primary Cesarian Section    No family history on file.  History  Substance Use Topics  . Smoking status: Never Smoker   . Smokeless tobacco: Never Used  . Alcohol Use: No    Allergies: No Known Allergies  Prescriptions prior to admission  Medication Sig Dispense Refill  . cephALEXin (KEFLEX) 500 MG capsule Take 1 capsule (500 mg total) by mouth 4 (four) times daily.  28 capsule  0  . ferrous sulfate 325 (65 FE) MG tablet Take 1 tablet (325 mg total) by mouth 2 (two) times daily with a meal.  60 tablet  5  . prenatal vitamin w/FE, FA (PRENATAL 1 + 1) 27-1 MG TABS Take 1 tablet by mouth daily.          Review of Systems  Gastrointestinal:       Incision site discharge  All other systems reviewed and are negative.   Physical Exam   Blood pressure 123/63, pulse 100, temperature 98.7 F (37.1 C), temperature source Oral, resp. rate 20, height 5\' 3"  (1.6 m), weight 92.625 kg (204 lb 3.2 oz), last menstrual period 07/23/2010, unknown if currently breastfeeding.  Physical Exam  Constitutional: She is oriented to person, place, and time. She appears well-developed and well-nourished.  HENT:  Head: Normocephalic.  Neck: Normal range of motion. Neck supple.  GI: Soft. There is no tenderness. There is no guarding.       Incision site  healing well; well approximated, no signs of infection; no palpable mass.  Small amount of brownish discharge from incision site.  Genitourinary: No bleeding around the vagina.  Neurological: She is alert and oriented to person, place, and time. She has normal reflexes.  Skin: Skin is warm and dry.    MAU Course  Procedures  Consulted with Dr. Helmut Muster 4x4 on incision and have pt come to office at 10 am tomorrow.    Assessment and Plan  Postsurgical Check  Plan: DC to home Continue antibiotics Appt with Dr. Gaynell Face in AM  [facility] 05/10/2011, 7:37 AM

## 2011-06-03 ENCOUNTER — Ambulatory Visit
Admission: RE | Admit: 2011-06-03 | Discharge: 2011-06-03 | Disposition: A | Discharge status: Home / Self Care | Payer: Medicaid Other | Source: Ambulatory Visit | Attending: Geriatric Medicine | Admitting: Geriatric Medicine

## 2011-06-03 ENCOUNTER — Other Ambulatory Visit: Payer: Self-pay | Admitting: Geriatric Medicine

## 2011-06-03 DIAGNOSIS — J189 Pneumonia, unspecified organism: Secondary | ICD-10-CM

## 2012-03-19 IMAGING — CR DG CHEST 2V
2 series · 2 of 2 positions shown · non-contrast
Comparison: None.

CLINICAL DATA: Cough and congestion.  Shortness of breath.

CHEST - 2 VIEW

[view not recorded (1 of 2)]
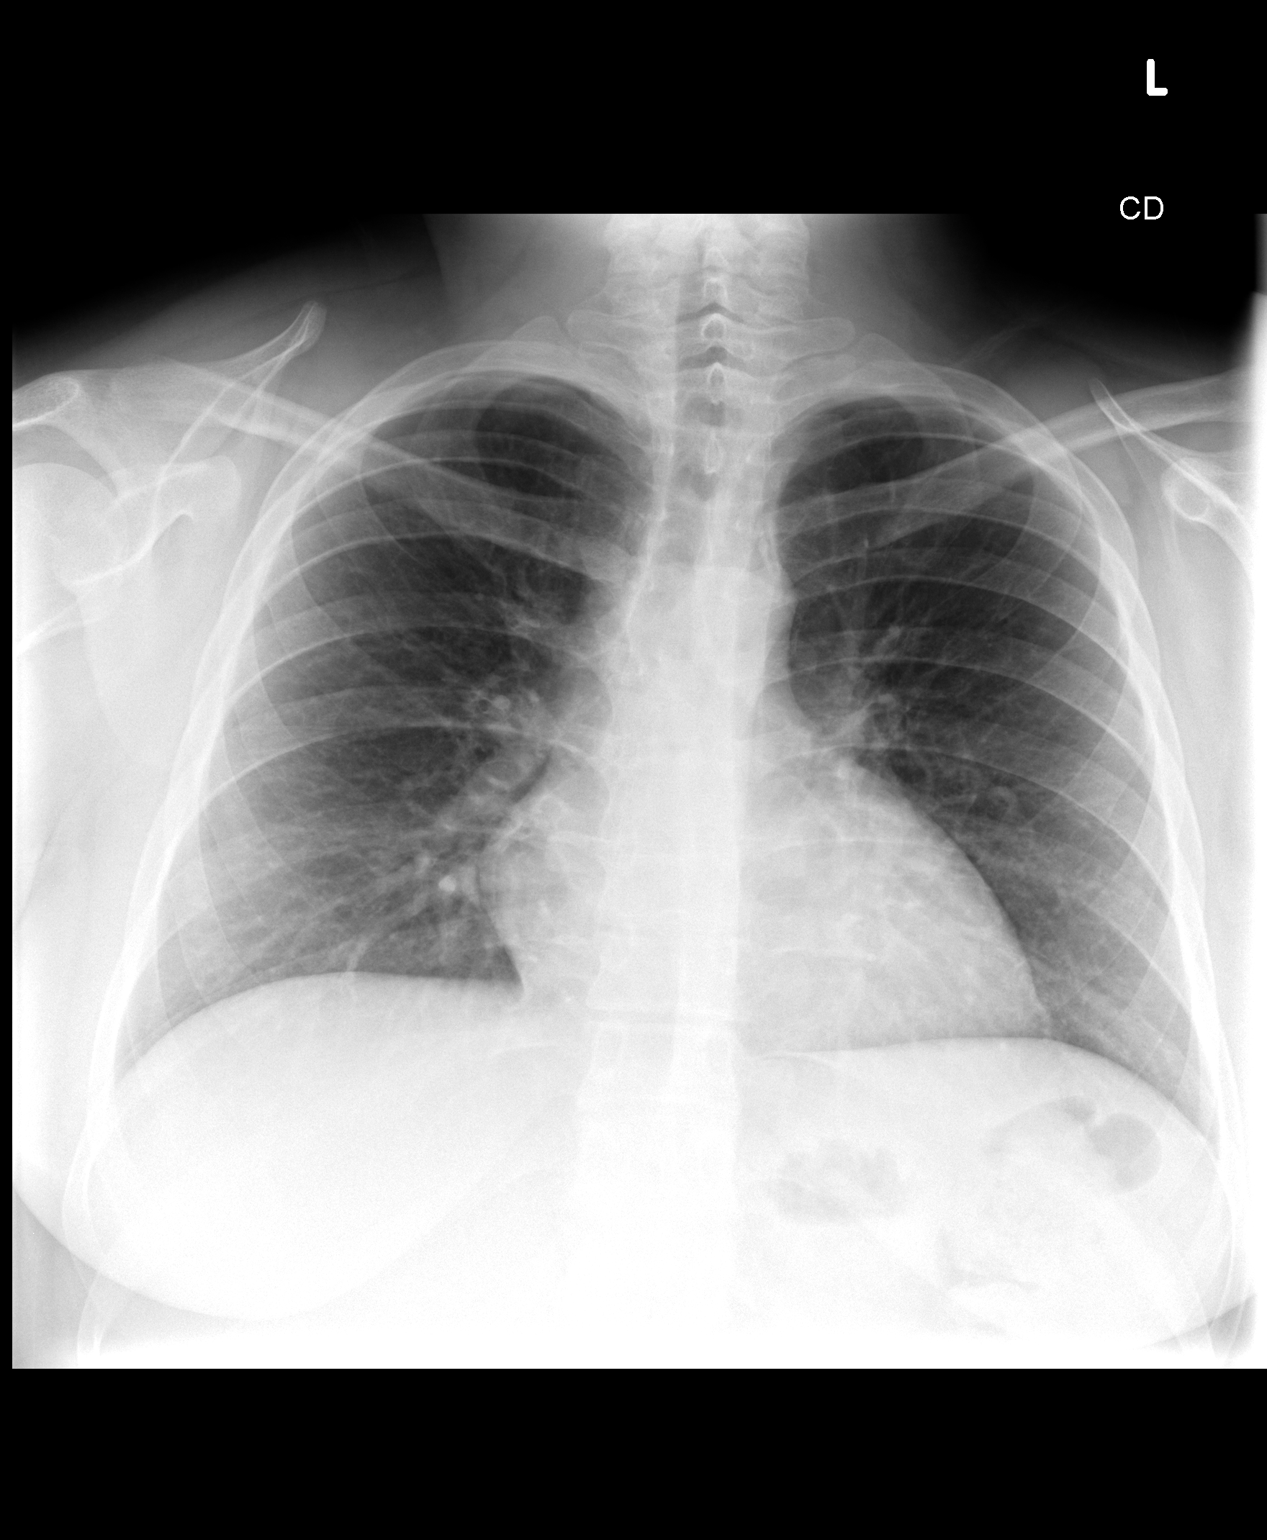

[view not recorded (2 of 2)]
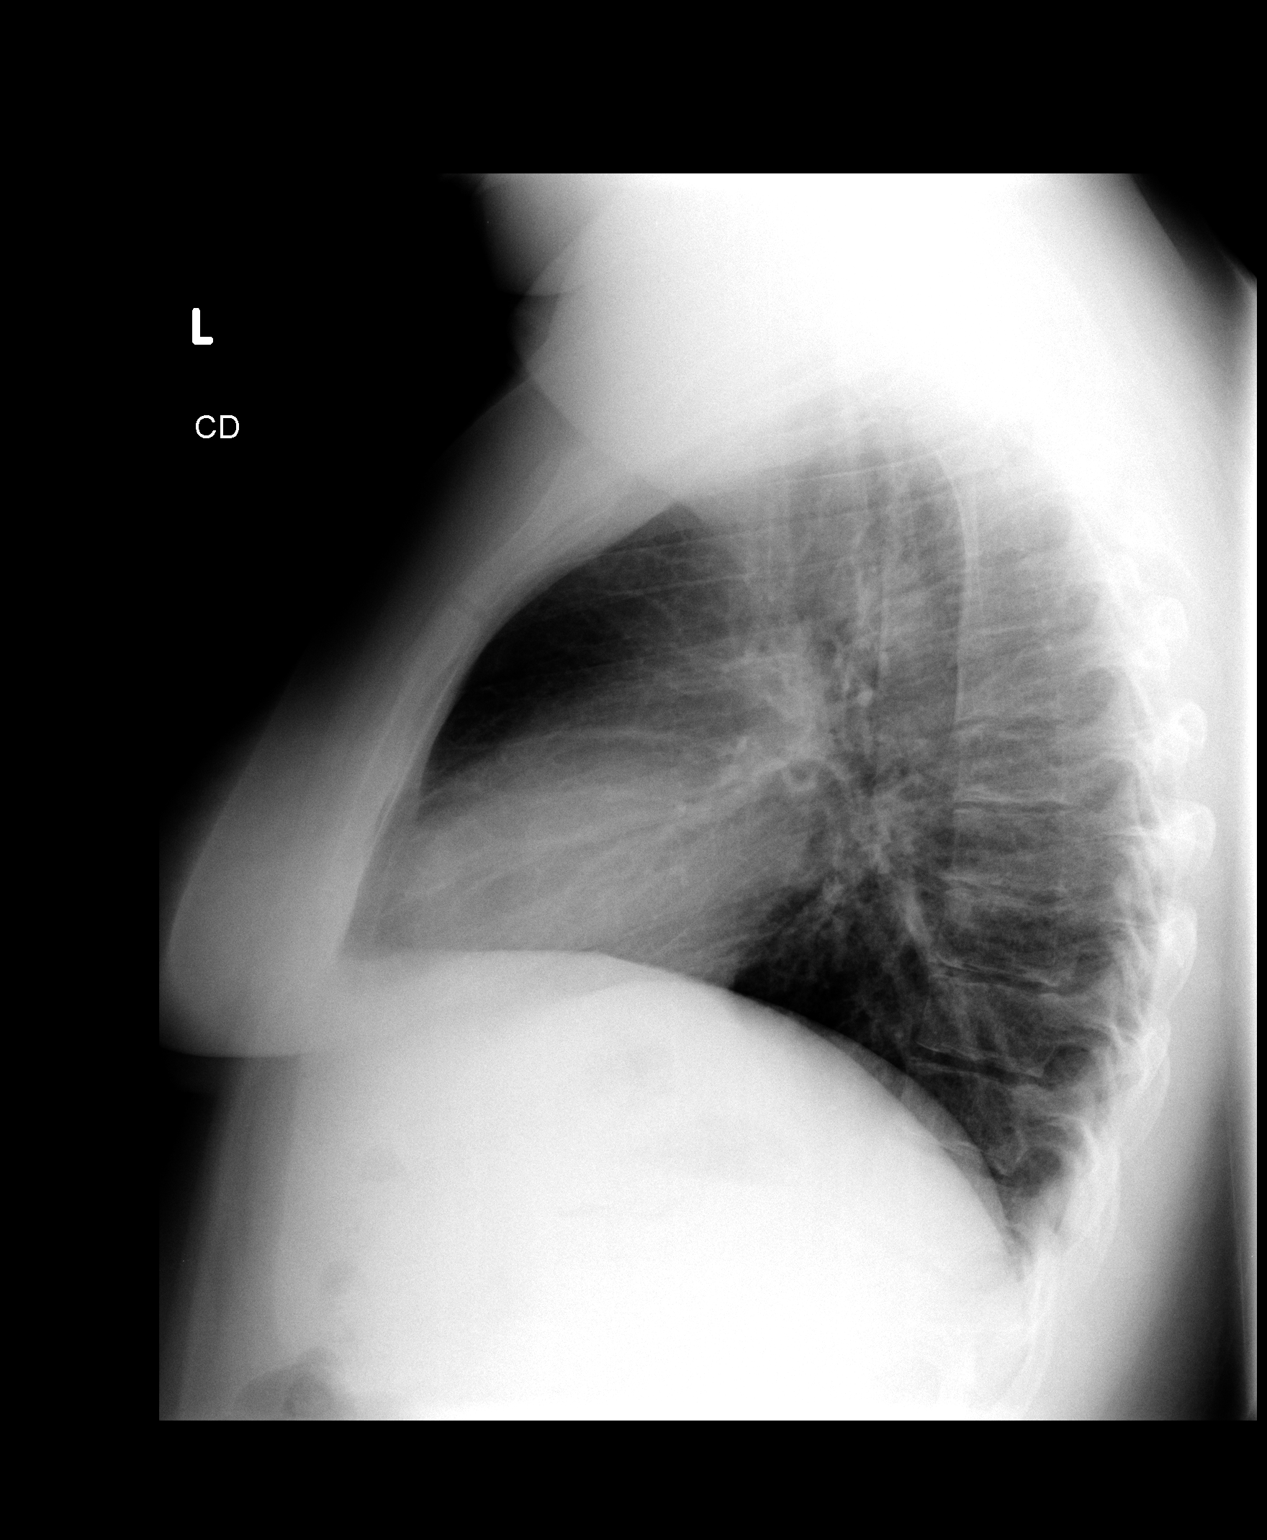

[2 of 2 positions shown; findings below may reference images not displayed]

FINDINGS: The lungs are clear without focal infiltrate, edema,
pneumothorax or pleural effusion. Cardiopericardial silhouette is
at upper limits of normal for size. Imaged bony structures of the
thorax are intact.
IMPRESSION: No acute cardiopulmonary findings.

## 2012-04-24 ENCOUNTER — Emergency Department (HOSPITAL_BASED_OUTPATIENT_CLINIC_OR_DEPARTMENT_OTHER)
Admission: EM | Admit: 2012-04-24 | Discharge: 2012-04-25 | Disposition: A | Discharge status: Home / Self Care | Payer: Self-pay | Attending: Emergency Medicine | Admitting: Emergency Medicine

## 2012-04-24 ENCOUNTER — Encounter (HOSPITAL_BASED_OUTPATIENT_CLINIC_OR_DEPARTMENT_OTHER): Payer: Self-pay | Admitting: *Deleted

## 2012-04-24 DIAGNOSIS — R202 Paresthesia of skin: Secondary | ICD-10-CM

## 2012-04-24 DIAGNOSIS — F17200 Nicotine dependence, unspecified, uncomplicated: Secondary | ICD-10-CM | POA: Insufficient documentation

## 2012-04-24 DIAGNOSIS — R209 Unspecified disturbances of skin sensation: Secondary | ICD-10-CM | POA: Insufficient documentation

## 2012-04-24 LAB — CBC WITH DIFFERENTIAL/PLATELET
Basophils Absolute: 0.1 10*3/uL (ref 0.0–0.1)
Basophils Relative: 1 % (ref 0–1)
HCT: 38.7 % (ref 36.0–46.0)
Lymphocytes Relative: 36 % (ref 12–46)
MCHC: 33.1 g/dL (ref 30.0–36.0)
Monocytes Absolute: 1 10*3/uL (ref 0.1–1.0)
Neutro Abs: 5.9 10*3/uL (ref 1.7–7.7)
Neutrophils Relative %: 53 % (ref 43–77)
Platelets: 325 10*3/uL (ref 150–400)
RDW: 13.7 % (ref 11.5–15.5)
WBC: 11.3 10*3/uL — ABNORMAL HIGH (ref 4.0–10.5)

## 2012-04-24 LAB — URINALYSIS, ROUTINE W REFLEX MICROSCOPIC
Bilirubin Urine: NEGATIVE
Ketones, ur: 15 mg/dL — AB
Nitrite: NEGATIVE
Specific Gravity, Urine: 1.031 — ABNORMAL HIGH (ref 1.005–1.030)
Urobilinogen, UA: 0.2 mg/dL (ref 0.0–1.0)

## 2012-04-24 LAB — URINE MICROSCOPIC-ADD ON

## 2012-04-24 NOTE — ED Provider Notes (Signed)
History    This chart was scribed for Meredith Lenahan Meredith Cords, MD, MD by Meredith [name], ED Scribe. The patient was seen in room MH10 and the patient's care was started at 11:25PM.   CSN: [id number]  Arrival date & time 04/24/12  2223      Chief Complaint  Patient presents with  . Tingling    (Consider location/radiation/quality/duration/timing/severity/associated sxs/prior treatment) The history is provided by the patient. No language interpreter was used.   Meredith [name] is a 21 y.o. female who presents to the Emergency Department complaining of constant, moderate tongue tingling onset 8 days ago. Pt reports that she has tingling feeling throughout her entire tongue symmetrically. Today she states she felt tingling sensation in her lips. Today she reports that she was shaking because she was upset. She reports that she also has tingling sensation in her toes of bilateral feet. Denies recent injury, hx of anxiety, taking birth control, vitamins and any other pain. LMP was 1 week ago.   Past Medical History  Diagnosis Date  . No pertinent past medical history     Past Surgical History  Procedure Date  . Cesarean section 04/29/2011    Procedure: CESAREAN SECTION;  Surgeon: Meredith Rainbow, MD;  Location: [facility];  Service: Gynecology;  Laterality: N/A;  Primary Cesarian Section    No family history on file.  History  Substance Use Topics  . Smoking status: Current Every Day Smoker  . Smokeless tobacco: Never Used  . Alcohol Use: 3.6 oz/week    6 Cans of beer per week    OB History    Grav Para Term Preterm Abortions TAB SAB Ect Mult Living   1 1 1       1       Review of Systems  Constitutional: Negative for fever.  HENT: Negative for neck pain.   Respiratory: Negative for cough and shortness of breath.   Cardiovascular: Negative for chest pain.  Gastrointestinal: Negative for abdominal pain.  Musculoskeletal: Negative for back pain and gait problem.  Skin:  Negative for rash.  Neurological: Positive for numbness. Negative for syncope, facial asymmetry, speech difficulty, weakness, light-headedness and headaches.  All other systems reviewed and are negative.    Allergies  Review of patient's allergies indicates no known allergies.  Home Medications   Current Outpatient Rx  Name  Route  Sig  Dispense  Refill  . FERROUS SULFATE 325 (65 FE) MG PO TABS   Oral   Take 1 tablet (325 mg total) by mouth 2 (two) times daily with a meal.   60 tablet   5   . PRENATAL PLUS 27-1 MG PO TABS   Oral   Take 1 tablet by mouth daily.             BP 137/76  Pulse 89  Temp 98.7 F (37.1 C) (Oral)  Resp 18  Ht 5\' 3"  (1.6 m)  Wt 210 lb (95.255 kg)  BMI 37.20 kg/m2  SpO2 100%  LMP 04/17/2012  Breastfeeding? No  Physical Exam  Nursing note and vitals reviewed. Constitutional: She is oriented to person, place, and time. She appears well-developed and well-nourished. No distress.  HENT:  Head: Normocephalic and atraumatic.  Mouth/Throat: Oropharynx is clear and moist.  Eyes: EOM are normal. Pupils are equal, round, and reactive to light.  Neck: Normal range of motion. Neck supple.  Cardiovascular: Normal rate, regular rhythm and intact distal pulses.   Pulmonary/Chest: Effort normal and breath sounds normal. No  respiratory distress.  Abdominal: Soft. Bowel sounds are normal. There is no tenderness. There is no rebound and no guarding.  Musculoskeletal: Normal range of motion. She exhibits no edema and no tenderness.  Lymphadenopathy:    She has cervical adenopathy.  Neurological: She is alert and oriented to person, place, and time. She has normal strength and normal reflexes. She displays normal reflexes. No cranial nerve deficit or sensory deficit. Coordination normal. GCS eye subscore is 4. GCS verbal subscore is 5. GCS motor subscore is 6. She displays no Babinski's sign on the right side. She displays no Babinski's sign on the left side.        Cranial nerves 2-12 intact Sensation intact in tongue  All sensory intact in entirety of mouth Sensation intact to entirety of the body including feet tested with pointed instrument 5/5 strength     Skin: Skin is warm and dry.  Psychiatric: She has a normal mood and affect. Her behavior is normal.    ED Course  Procedures (including critical care time) DIAGNOSTIC STUDIES: Oxygen Saturation is 100% on room air, normal by my interpretation.    COORDINATION OF CARE: 11:31 PM Discussed ED treatment with pt     Labs Reviewed  CBC WITH DIFFERENTIAL - Abnormal; Notable for the following:    WBC 11.3 (*)     All other components within normal limits  URINALYSIS, ROUTINE W REFLEX MICROSCOPIC - Abnormal; Notable for the following:    APPearance CLOUDY (*)     Specific Gravity, Urine 1.031 (*)     Ketones, ur 15 (*)     Leukocytes, UA SMALL (*)     All other components within normal limits  URINE MICROSCOPIC-ADD ON - Abnormal; Notable for the following:    Squamous Epithelial / LPF FEW (*)     Bacteria, UA MANY (*)     All other components within normal limits  BASIC METABOLIC PANEL  MAGNESIUM  PREGNANCY, URINE  URINE CULTURE   No results found.   No diagnosis found.    MDM  Case d/w Dr. Amada Jupiter.  No neuro cause for entire tongue paresthesias.  Follow up with PMD. Patient to follow up with PMD.  Suspect anxiety.       I personally performed the services described in this documentation, which was scribed in my presence. The recorded information has been reviewed and is accurate.    Meredith Laforge Meredith Cords, MD 04/25/12 0130

## 2012-04-24 NOTE — ED Notes (Signed)
Pt reports she has had tingling in her tongue and toes x 1 week- states she felt tingling in her back last night

## 2012-04-25 ENCOUNTER — Encounter (HOSPITAL_BASED_OUTPATIENT_CLINIC_OR_DEPARTMENT_OTHER): Payer: Self-pay | Admitting: Emergency Medicine

## 2012-04-25 LAB — MAGNESIUM: Magnesium: 2.2 mg/dL (ref 1.5–2.5)

## 2012-04-25 LAB — BASIC METABOLIC PANEL
Chloride: 104 mEq/L (ref 96–112)
Creatinine, Ser: 0.9 mg/dL (ref 0.50–1.10)
GFR calc Af Amer: >90 mL/min (ref >90)
Sodium: 141 mEq/L (ref 135–145)

## 2012-04-26 LAB — URINE CULTURE: Colony Count: 40000

## 2012-11-27 ENCOUNTER — Encounter (HOSPITAL_BASED_OUTPATIENT_CLINIC_OR_DEPARTMENT_OTHER): Payer: Self-pay | Admitting: *Deleted

## 2012-11-27 ENCOUNTER — Emergency Department (HOSPITAL_BASED_OUTPATIENT_CLINIC_OR_DEPARTMENT_OTHER)
Admission: EM | Admit: 2012-11-27 | Discharge: 2012-11-27 | Disposition: A | Discharge status: Home / Self Care | Payer: Self-pay | Attending: Emergency Medicine | Admitting: Emergency Medicine

## 2012-11-27 DIAGNOSIS — F17200 Nicotine dependence, unspecified, uncomplicated: Secondary | ICD-10-CM | POA: Insufficient documentation

## 2012-11-27 DIAGNOSIS — Z331 Pregnant state, incidental: Secondary | ICD-10-CM | POA: Insufficient documentation

## 2012-11-27 DIAGNOSIS — Z79899 Other long term (current) drug therapy: Secondary | ICD-10-CM | POA: Insufficient documentation

## 2012-11-27 LAB — URINALYSIS, ROUTINE W REFLEX MICROSCOPIC
Bilirubin Urine: NEGATIVE
Hgb urine dipstick: NEGATIVE
Ketones, ur: >80 mg/dL — AB
Protein, ur: NEGATIVE mg/dL
Specific Gravity, Urine: 1.029 (ref 1.005–1.030)
Urobilinogen, UA: 1 mg/dL (ref 0.0–1.0)

## 2012-11-27 LAB — URINE MICROSCOPIC-ADD ON

## 2012-11-27 MED ORDER — PRENATAL COMPLETE 14-0.4 MG PO TABS: 1.0000 | ORAL_TABLET | Freq: Every day | ORAL | Status: DC

## 2012-11-27 MED ORDER — ONDANSETRON 4 MG PO TBDP: 4.0000 mg | ORAL_TABLET | Freq: Three times a day (TID) | ORAL | Status: DC | PRN

## 2012-11-27 MED ORDER — ONDANSETRON 4 MG PO TBDP
4.0000 mg | ORAL_TABLET | Freq: Once | ORAL | Status: AC
Start: 2012-11-27 — End: 2012-11-27
  Administered 2012-11-27: 4 mg via ORAL
  Filled 2012-11-27: qty 1

## 2012-11-27 NOTE — ED Notes (Addendum)
Nausea and decreased appetite since Wed. Pt states she had a miscarriage in April.

## 2012-11-27 NOTE — ED Provider Notes (Signed)
History     CSN: [id number]  Arrival date & time 11/27/12  1245   First MD Initiated Contact with Patient 11/27/12 1328      Chief Complaint  Patient presents with  . Nausea    (Consider location/radiation/quality/duration/timing/severity/associated sxs/prior treatment) HPI Comments: Patient is a 22 year old female who presents with a 5 day history of nausea. Symptoms started gradually and remained constant since the onset. No aggravating/alleviating factors. No associated symptoms. Patient has not tried anything for symptoms. Patient reports having a miscarriage in April 2014. Patient does not know when her last menstrual cycle was.    Past Medical History  Diagnosis Date  . No pertinent past medical history     Past Surgical History  Procedure Laterality Date  . Cesarean section  04/29/2011    Procedure: CESAREAN SECTION;  Surgeon: Roseanna Rainbow, MD;  Location: [facility];  Service: Gynecology;  Laterality: N/A;  Primary Cesarian Section    History reviewed. No pertinent family history.  History  Substance Use Topics  . Smoking status: Current Every Day Smoker  . Smokeless tobacco: Never Used  . Alcohol Use: No    OB History   Grav Para Term Preterm Abortions TAB SAB Ect Mult Living   1 1 1       1       Review of Systems  Gastrointestinal: Positive for nausea.  All other systems reviewed and are negative.    Allergies  Review of patient's allergies indicates no known allergies.  Home Medications   Current Outpatient Rx  Name  Route  Sig  Dispense  Refill  . EXPIRED: ferrous sulfate 325 (65 FE) MG tablet   Oral   Take 1 tablet (325 mg total) by mouth 2 (two) times daily with a meal.   60 tablet   5   . prenatal vitamin w/FE, FA (PRENATAL 1 + 1) 27-1 MG TABS   Oral   Take 1 tablet by mouth daily.             BP 134/65  Temp(Src) 99.6 F (37.6 C) (Oral)  Resp 18  Ht 5\' 3"  (1.6 m)  Wt 210 lb (95.255 kg)  BMI 37.21 kg/m2  SpO2 100%  LMP  09/27/2012  Physical Exam  Nursing note and vitals reviewed. Constitutional: She is oriented to person, place, and time. She appears well-developed and well-nourished. No distress.  HENT:  Head: Normocephalic and atraumatic.  Eyes: Conjunctivae are normal.  Neck: Normal range of motion.  Cardiovascular: Normal rate and regular rhythm.  Exam reveals no gallop and no friction rub.   No murmur heard. Pulmonary/Chest: Effort normal and breath sounds normal. She has no wheezes. She has no rales. She exhibits no tenderness.  Abdominal: Soft. She exhibits no distension. There is no tenderness. There is no rebound and no guarding.  Musculoskeletal: Normal range of motion.  Neurological: She is alert and oriented to person, place, and time. Coordination normal.  Speech is goal-oriented. Moves limbs without ataxia.   Skin: Skin is warm and dry.  Psychiatric: She has a normal mood and affect. Her behavior is normal.    ED Course  Procedures (including critical care time)  Labs Reviewed  URINALYSIS, ROUTINE W REFLEX MICROSCOPIC - Abnormal; Notable for the following:    Color, Urine AMBER (*)    APPearance CLOUDY (*)    Ketones, ur >80 (*)    Leukocytes, UA MODERATE (*)    All other components within normal limits  PREGNANCY, URINE - Abnormal; Notable for the following:    Preg Test, Ur POSITIVE (*)    All other components within normal limits  URINE MICROSCOPIC-ADD ON - Abnormal; Notable for the following:    Squamous Epithelial / LPF MANY (*)    Bacteria, UA MANY (*)    All other components within normal limits  URINE CULTURE   No results found.   1. Pregnancy as incidental finding       MDM  1:49 PM Patient's urine pregnancy test positive. Urinalysis shows no UTI. Patient informed of results. She has an OBGYN to follow up with. I will prescribe prenatal vitamins. No abdominal pain or vaginal bleeding. Vitals stable and patient afebrile.         Emilia Beck,  PA-C 11/27/12 1621

## 2012-11-28 NOTE — ED Provider Notes (Signed)
Medical screening examination/treatment/procedure(s) were performed by non-physician practitioner and as supervising physician I was immediately available for consultation/collaboration.  Geoffery Lyons, MD 11/28/12 1110

## 2012-11-29 LAB — URINE CULTURE: Colony Count: 15000

## 2013-03-29 ENCOUNTER — Encounter: Payer: Self-pay | Admitting: Advanced Practice Midwife

## 2013-03-29 ENCOUNTER — Ambulatory Visit (INDEPENDENT_AMBULATORY_CARE_PROVIDER_SITE_OTHER): Payer: Medicaid Other | Admitting: Advanced Practice Midwife

## 2013-03-29 VITALS — BP 123/76 | Temp 97.3°F | Ht 62.0 in | Wt 215.8 lb

## 2013-03-29 DIAGNOSIS — O34219 Maternal care for unspecified type scar from previous cesarean delivery: Secondary | ICD-10-CM

## 2013-03-29 DIAGNOSIS — O0930 Supervision of pregnancy with insufficient antenatal care, unspecified trimester: Secondary | ICD-10-CM

## 2013-03-29 DIAGNOSIS — Z23 Encounter for immunization: Secondary | ICD-10-CM

## 2013-03-29 LAB — POCT URINALYSIS DIP (DEVICE)
Bilirubin Urine: NEGATIVE
Glucose, UA: NEGATIVE mg/dL
Hgb urine dipstick: NEGATIVE
Ketones, ur: NEGATIVE mg/dL
Nitrite: NEGATIVE
Protein, ur: NEGATIVE mg/dL
Specific Gravity, Urine: >=1.03 (ref 1.005–1.030)
Urobilinogen, UA: 0.2 mg/dL (ref 0.0–1.0)
pH: 6 (ref 5.0–8.0)

## 2013-03-29 NOTE — Progress Notes (Signed)
P=  105, Her for initial ob visit. Had been to ER in June, states waited to start because waiting on Medicaid.  Given new patient information. Discussed bmi/appropriate weight gain.

## 2013-03-29 NOTE — Progress Notes (Signed)
   Subjective:    Meredith [name] is a [id number] Unknown being seen today for her first obstetrical visit.  Her obstetrical history is significant for C/S at term with previous pregnancy after pushing. Patient does intend to breast feed. Pregnancy history fully reviewed.  Patient reports no complaints.  Filed Vitals:   03/29/13 0954 03/29/13 1001  BP: 123/76   Temp: 97.3 F (36.3 C)   Height:  5\' 2"  (1.575 m)  Weight: 97.886 kg (215 lb 12.8 oz)     HISTORY: OB History  Gravida Para Term Preterm AB SAB TAB Ectopic Multiple Living  3 1 1  1 1    1     # Outcome Date GA Lbr Len/2nd Weight Sex Delivery Anes PTL Lv  3 CUR           2 SAB 09/2012 [redacted]w[redacted]d         1 TRM 04/29/11 [redacted]w[redacted]d / 05:22 3.23 kg (7 lb 1.9 oz) F LTCS EPI  Y     Comments: normal newborn     Past Medical History  Diagnosis Date  . No pertinent past medical history    Past Surgical History  Procedure Laterality Date  . Cesarean section  04/29/2011    Procedure: CESAREAN SECTION;  Surgeon: Roseanna Rainbow, MD;  Location: [facility];  Service: Gynecology;  Laterality: N/A;  Primary Cesarian Section   History reviewed. No pertinent family history.   Exam    Uterus:   ~ 16 week size  FHT 145 by doppler  Pelvic Exam:    Perineum: No Hemorrhoids, Normal Perineum   Vulva: normal   Vagina:  normal mucosa, normal discharge   pH:    Cervix: no cervical motion tenderness and no lesions   Adnexa: normal adnexa and no mass, fullness, tenderness   Bony Pelvis: average  System: Breast:  normal appearance, no masses or tenderness   Skin: normal coloration and turgor, no rashes    Neurologic: oriented, normal, gait normal; reflexes normal and symmetric   Extremities: normal strength, tone, and muscle mass, ROM of all joints is normal   HEENT neck supple with midline trachea and thyroid without masses   Mouth/Teeth mucous membranes moist, pharynx normal without lesions   Neck supple and no masses   Cardiovascular:  regular rate and rhythm   Respiratory:  appears well, vitals normal, no respiratory distress, acyanotic, normal RR, ear and throat exam is normal, neck free of mass or lymphadenopathy, chest clear, no wheezing, crepitations, rhonchi, normal symmetric air entry   Abdomen: soft, non-tender; bowel sounds normal; no masses,  no organomegaly   Urinary: urethral meatus normal      Assessment:    Pregnancy: G3P1011 There are no active problems to display for this patient.       Plan:     Initial labs drawn. Prenatal vitamins. Problem list reviewed and updated. Genetic Screening discussed First Screen: undecided. Unknown gestation today--will reevaluate.   Ultrasound discussed; fetal survey: ordered.  Follow up in 4 weeks. 50% of 30 min visit spent on counseling and coordination of care.     LEFTWICH-KIRBY, Eloy Fehl 03/29/2013

## 2013-03-30 LAB — OBSTETRIC PANEL
Antibody Screen: NEGATIVE
Eosinophils Relative: 1 % (ref 0–5)
HCT: 31.3 % — ABNORMAL LOW (ref 36.0–46.0)
Lymphocytes Relative: 17 % (ref 12–46)
Lymphs Abs: 1.9 10*3/uL (ref 0.7–4.0)
MCV: 79 fL (ref 78.0–100.0)
Monocytes Absolute: 0.5 10*3/uL (ref 0.1–1.0)
Monocytes Relative: 5 % (ref 3–12)
RBC: 3.96 MIL/uL (ref 3.87–5.11)
RDW: 14.4 % (ref 11.5–15.5)
Rubella: 0.67 Index (ref <0.90)
WBC: 10.7 10*3/uL — ABNORMAL HIGH (ref 4.0–10.5)

## 2013-03-30 LAB — HIV ANTIBODY (ROUTINE TESTING W REFLEX): HIV: NONREACTIVE

## 2013-03-30 LAB — GC/CHLAMYDIA PROBE AMP: CT Probe RNA: POSITIVE — AB

## 2013-03-31 ENCOUNTER — Encounter: Payer: Self-pay | Admitting: Advanced Practice Midwife

## 2013-03-31 DIAGNOSIS — O34219 Maternal care for unspecified type scar from previous cesarean delivery: Secondary | ICD-10-CM | POA: Insufficient documentation

## 2013-03-31 DIAGNOSIS — O0930 Supervision of pregnancy with insufficient antenatal care, unspecified trimester: Secondary | ICD-10-CM | POA: Insufficient documentation

## 2013-03-31 LAB — PRESCRIPTION MONITORING PROFILE (19 PANEL)
Benzodiazepine Screen, Urine: NEGATIVE ng/mL
Buprenorphine, Urine: NEGATIVE ng/mL
Cocaine Metabolites: NEGATIVE ng/mL
Fentanyl, Ur: NEGATIVE ng/mL
MDMA URINE: NEGATIVE ng/mL
Meperidine, Ur: NEGATIVE ng/mL
Methaqualone: NEGATIVE ng/mL
Opiate Screen, Urine: NEGATIVE ng/mL
Propoxyphene: NEGATIVE ng/mL
Zolpidem, Urine: NEGATIVE ng/mL

## 2013-03-31 LAB — HEMOGLOBINOPATHY EVALUATION
Hgb A2 Quant: 2.7 % (ref 2.2–3.2)
Hgb A: 97.3 % (ref 96.8–97.8)
Hgb F Quant: 0 % (ref 0.0–2.0)

## 2013-04-03 ENCOUNTER — Other Ambulatory Visit: Payer: Self-pay | Admitting: Advanced Practice Midwife

## 2013-04-03 ENCOUNTER — Ambulatory Visit (HOSPITAL_COMMUNITY): Admission: RE | Admit: 2013-04-03 | Payer: Medicaid Other | Source: Ambulatory Visit

## 2013-04-03 ENCOUNTER — Ambulatory Visit (HOSPITAL_COMMUNITY)
Admission: RE | Admit: 2013-04-03 | Discharge: 2013-04-03 | Disposition: A | Discharge status: Home / Self Care | Payer: Medicaid Other | Source: Ambulatory Visit | Attending: Advanced Practice Midwife | Admitting: Advanced Practice Midwife

## 2013-04-03 DIAGNOSIS — Z23 Encounter for immunization: Secondary | ICD-10-CM

## 2013-04-03 DIAGNOSIS — Z3689 Encounter for other specified antenatal screening: Secondary | ICD-10-CM | POA: Insufficient documentation

## 2013-04-03 DIAGNOSIS — O0930 Supervision of pregnancy with insufficient antenatal care, unspecified trimester: Secondary | ICD-10-CM

## 2013-04-04 ENCOUNTER — Encounter: Payer: Self-pay | Admitting: Advanced Practice Midwife

## 2013-04-06 ENCOUNTER — Telehealth: Payer: Self-pay | Admitting: *Deleted

## 2013-04-06 DIAGNOSIS — O358XX1 Maternal care for other (suspected) fetal abnormality and damage, fetus 1: Secondary | ICD-10-CM

## 2013-04-06 DIAGNOSIS — O0933 Supervision of pregnancy with insufficient antenatal care, third trimester: Secondary | ICD-10-CM

## 2013-04-06 NOTE — Telephone Encounter (Addendum)
Message copied by Jill Side on Thu Apr 06, 2013  4:22 PM ------      Message from: [place], [place] L      Created: Wed Apr 05, 2013  1:06 PM       Karl Luke, i believe this is a [facility] pt.  Thanks      ----- Message -----         From: Hurshel Party, CNM         Sent: 04/04/2013   9:54 PM           To: Blossom Hoops, RN            Pt wanted genetic testing but unknown EDC, I estimated 16-18 weeks in office last week but U/S result shows she is 25 weeks and baby has bilateral pyelectasis.  Please call her this week to let her know she is too late for genetic screening and will need a repeat U/S in 4 weeks and an OB visit in 2 weeks with glucose test.  Thank you:)       ------ Called pt and left message that I am calling with some information from the midwife. Please call back and indicate whether we may leave detailed information on your voice mail.  **Pt needs to be informed that she is too far along in her pregnancy for genetic testing. Also, she will need 1hr GTT  @ her next visit on 11/12 and ultrasound for follow up growth has been scheduled on 11/20 @ 0900

## 2013-04-20 NOTE — Telephone Encounter (Signed)
Called Corri and notified her she is too far along to get genetic testing. Reviewed her next appointment date and plan to get 1 hr GTT then. Also gave her ultrasound appointment date/time. Patient voices understanding.

## 2013-04-26 ENCOUNTER — Encounter: Payer: Self-pay | Admitting: Family Medicine

## 2013-04-26 ENCOUNTER — Ambulatory Visit (INDEPENDENT_AMBULATORY_CARE_PROVIDER_SITE_OTHER): Payer: Medicaid Other | Admitting: Family Medicine

## 2013-04-26 VITALS — BP 115/74 | Temp 97.6°F | Wt 218.8 lb

## 2013-04-26 DIAGNOSIS — Z23 Encounter for immunization: Secondary | ICD-10-CM

## 2013-04-26 DIAGNOSIS — O34219 Maternal care for unspecified type scar from previous cesarean delivery: Secondary | ICD-10-CM

## 2013-04-26 DIAGNOSIS — O0931 Supervision of pregnancy with insufficient antenatal care, first trimester: Secondary | ICD-10-CM

## 2013-04-26 DIAGNOSIS — O0930 Supervision of pregnancy with insufficient antenatal care, unspecified trimester: Secondary | ICD-10-CM

## 2013-04-26 LAB — POCT URINALYSIS DIP (DEVICE)
Glucose, UA: NEGATIVE mg/dL
Hgb urine dipstick: NEGATIVE
Ketones, ur: NEGATIVE mg/dL
Nitrite: NEGATIVE
Protein, ur: NEGATIVE mg/dL
Specific Gravity, Urine: >=1.03 (ref 1.005–1.030)
Urobilinogen, UA: 0.2 mg/dL (ref 0.0–1.0)
pH: 6 (ref 5.0–8.0)

## 2013-04-26 LAB — CBC
HCT: 30 % — ABNORMAL LOW (ref 36.0–46.0)
Hemoglobin: 10.3 g/dL — ABNORMAL LOW (ref 12.0–15.0)
MCH: 26.3 pg (ref 26.0–34.0)
MCHC: 34.3 g/dL (ref 30.0–36.0)
MCV: 76.5 fL — ABNORMAL LOW (ref 78.0–100.0)
RDW: 14.3 % (ref 11.5–15.5)

## 2013-04-26 MED ORDER — TETANUS-DIPHTH-ACELL PERTUSSIS 5-2.5-18.5 LF-MCG/0.5 IM SUSP: 0.5000 mL | Freq: Once | INTRAMUSCULAR | Status: DC

## 2013-04-26 NOTE — Patient Instructions (Signed)
Third Trimester of Pregnancy  The third trimester is from week 29 through week 42, months 7 through 9. The third trimester is a time when the fetus is growing rapidly. At the end of the ninth month, the fetus is about 20 inches in length and weighs 6 10 pounds.   BODY CHANGES  Your body goes through many changes during pregnancy. The changes vary from woman to woman.    Your weight will continue to increase. You can expect to gain 25 35 pounds (11 16 kg) by the end of the pregnancy.   You may begin to get stretch marks on your hips, abdomen, and breasts.   You may urinate more often because the fetus is moving lower into your pelvis and pressing on your bladder.   You may develop or continue to have heartburn as a result of your pregnancy.   You may develop constipation because certain hormones are causing the muscles that push waste through your intestines to slow down.   You may develop hemorrhoids or swollen, bulging veins (varicose veins).   You may have pelvic pain because of the weight gain and pregnancy hormones relaxing your joints between the bones in your pelvis. Back aches may result from over exertion of the muscles supporting your posture.   Your breasts will continue to grow and be tender. A yellow discharge may leak from your breasts called colostrum.   Your belly button may stick out.   You may feel short of breath because of your expanding uterus.   You may notice the fetus "dropping," or moving lower in your abdomen.   You may have a bloody mucus discharge. This usually occurs a few days to a week before labor begins.   Your cervix becomes thin and soft (effaced) near your due date.  WHAT TO EXPECT AT YOUR PRENATAL EXAMS   You will have prenatal exams every 2 weeks until week 36. Then, you will have weekly prenatal exams. During a routine prenatal visit:   You will be weighed to make sure you and the fetus are growing normally.   Your blood pressure is taken.   Your abdomen will be  measured to track your baby's growth.   The fetal heartbeat will be listened to.   Any test results from the previous visit will be discussed.   You may have a cervical check near your due date to see if you have effaced.  At around 36 weeks, your caregiver will check your cervix. At the same time, your caregiver will also perform a test on the secretions of the vaginal tissue. This test is to determine if a type of bacteria, Group B streptococcus, is present. Your caregiver will explain this further.  Your caregiver may ask you:   What your birth plan is.   How you are feeling.   If you are feeling the baby move.   If you have had any abnormal symptoms, such as leaking fluid, bleeding, severe headaches, or abdominal cramping.   If you have any questions.  Other tests or screenings that may be performed during your third trimester include:   Blood tests that check for low iron levels (anemia).   Fetal testing to check the health, activity level, and growth of the fetus. Testing is done if you have certain medical conditions or if there are problems during the pregnancy.  FALSE LABOR  You may feel small, irregular contractions that eventually go away. These are called Braxton Hicks contractions, or   false labor. Contractions may last for hours, days, or even weeks before true labor sets in. If contractions come at regular intervals, intensify, or become painful, it is best to be seen by your caregiver.   SIGNS OF LABOR    Menstrual-like cramps.   Contractions that are 5 minutes apart or less.   Contractions that start on the top of the uterus and spread down to the lower abdomen and back.   A sense of increased pelvic pressure or back pain.   A watery or bloody mucus discharge that comes from the vagina.  If you have any of these signs before the 37th week of pregnancy, call your caregiver right away. You need to go to the hospital to get checked immediately.  HOME CARE INSTRUCTIONS    Avoid all  smoking, herbs, alcohol, and unprescribed drugs. These chemicals affect the formation and growth of the baby.   Follow your caregiver's instructions regarding medicine use. There are medicines that are either safe or unsafe to take during pregnancy.   Exercise only as directed by your caregiver. Experiencing uterine cramps is a good sign to stop exercising.   Continue to eat regular, healthy meals.   Wear a good support bra for breast tenderness.   Do not use hot tubs, steam rooms, or saunas.   Wear your seat belt at all times when driving.   Avoid raw meat, uncooked cheese, cat litter boxes, and soil used by cats. These carry germs that can cause birth defects in the baby.   Take your prenatal vitamins.   Try taking a stool softener (if your caregiver approves) if you develop constipation. Eat more high-fiber foods, such as fresh vegetables or fruit and whole grains. Drink plenty of fluids to keep your urine clear or pale yellow.   Take warm sitz baths to soothe any pain or discomfort caused by hemorrhoids. Use hemorrhoid cream if your caregiver approves.   If you develop varicose veins, wear support hose. Elevate your feet for 15 minutes, 3 4 times a day. Limit salt in your diet.   Avoid heavy lifting, wear low heal shoes, and practice good posture.   Rest a lot with your legs elevated if you have leg cramps or low back pain.   Visit your dentist if you have not gone during your pregnancy. Use a soft toothbrush to brush your teeth and be gentle when you floss.   A sexual relationship may be continued unless your caregiver directs you otherwise.   Do not travel far distances unless it is absolutely necessary and only with the approval of your caregiver.   Take prenatal classes to understand, practice, and ask questions about the labor and delivery.   Make a trial run to the hospital.   Pack your hospital bag.   Prepare the baby's nursery.   Continue to go to all your prenatal visits as directed  by your caregiver.  SEEK MEDICAL CARE IF:   You are unsure if you are in labor or if your water has broken.   You have dizziness.   You have mild pelvic cramps, pelvic pressure, or nagging pain in your abdominal area.   You have persistent nausea, vomiting, or diarrhea.   You have a bad smelling vaginal discharge.   You have pain with urination.  SEEK IMMEDIATE MEDICAL CARE IF:    You have a fever.   You are leaking fluid from your vagina.   You have spotting or bleeding from your vagina.     You have severe abdominal cramping or pain.   You have rapid weight loss or gain.   You have shortness of breath with chest pain.   You notice sudden or extreme swelling of your face, hands, ankles, feet, or legs.   You have not felt your baby move in over an hour.   You have severe headaches that do not go away with medicine.   You have vision changes.  Document Released: 05/26/2001 Document Revised: 02/01/2013 Document Reviewed: 08/02/2012  [employer] Patient Information 2014 [employer]

## 2013-04-26 NOTE — Progress Notes (Signed)
Pulse- 97 Edema-feet

## 2013-04-26 NOTE — Progress Notes (Signed)
+  FM, no  Lof, no vb, no ctx  Meredith [name] is a 22 y.o. G3P1011 at [redacted]w[redacted]d by L=25 here for ROB visit.  Discussed with Patient:  -Plans to breast feed.  All questions answered. -Continue prenatal vitamins.   -Reviewed fetal kick counts (Pt to perform daily at a time when the baby is active, lie laterally with both hands on belly in quiet room and count all movements (hiccups, shoulder rolls, obvious kicks, etc); pt is to report to clinic or MAU for less than 10 movements felt in a one hour time period-pt told as soon as she counts 10 movements the count is complete.)  - Routine precautions discussed (depression, infection s/s).   Patient provided with all pertinent phone numbers for emergencies. - RTC for any VB, regular, painful cramps/ctxs occurring at a rate of >2/10 min, fever (100.5 or higher), n/v/d, any pain that is unresolving or worsening, LOF, decreased fetal movement, CP, SOB, edema  Problems: Patient Active Problem List   Diagnosis Date Noted  . Late prenatal care complicating pregnancy 03/31/2013  . History of cesarean delivery, currently pregnant 03/31/2013    To Do: 1. Glucose tolerance test ordered.  Patient will draw in clinic.  Will f/u test and amend plan based on results. 2. CBC and antibody screen ordered.   [ ]  Vaccines: Flu: recd Tdap: today [ ]  BCM: considering mirena  Edu: [ ]  PTL precautions; [ ]  BF class; [ ]  childbirth class; [ ]   BF counseling;

## 2013-04-27 LAB — RPR

## 2013-05-04 ENCOUNTER — Encounter: Payer: Self-pay | Admitting: *Deleted

## 2013-05-04 ENCOUNTER — Ambulatory Visit (HOSPITAL_COMMUNITY): Payer: Medicaid Other | Attending: Advanced Practice Midwife

## 2013-05-08 ENCOUNTER — Encounter (HOSPITAL_BASED_OUTPATIENT_CLINIC_OR_DEPARTMENT_OTHER): Payer: Self-pay | Admitting: Emergency Medicine

## 2013-05-08 ENCOUNTER — Emergency Department (HOSPITAL_BASED_OUTPATIENT_CLINIC_OR_DEPARTMENT_OTHER)
Admission: EM | Admit: 2013-05-08 | Discharge: 2013-05-08 | Disposition: A | Discharge status: Home / Self Care | Payer: Medicaid Other | Attending: Emergency Medicine | Admitting: Emergency Medicine

## 2013-05-08 DIAGNOSIS — Z87891 Personal history of nicotine dependence: Secondary | ICD-10-CM | POA: Insufficient documentation

## 2013-05-08 DIAGNOSIS — Y929 Unspecified place or not applicable: Secondary | ICD-10-CM | POA: Insufficient documentation

## 2013-05-08 DIAGNOSIS — S91309A Unspecified open wound, unspecified foot, initial encounter: Secondary | ICD-10-CM | POA: Insufficient documentation

## 2013-05-08 DIAGNOSIS — W268XXA Contact with other sharp object(s), not elsewhere classified, initial encounter: Secondary | ICD-10-CM | POA: Insufficient documentation

## 2013-05-08 DIAGNOSIS — Y939 Activity, unspecified: Secondary | ICD-10-CM | POA: Insufficient documentation

## 2013-05-08 DIAGNOSIS — S91332A Puncture wound without foreign body, left foot, initial encounter: Secondary | ICD-10-CM

## 2013-05-08 DIAGNOSIS — O9989 Other specified diseases and conditions complicating pregnancy, childbirth and the puerperium: Secondary | ICD-10-CM | POA: Insufficient documentation

## 2013-05-08 MED ORDER — TETANUS-DIPHTH-ACELL PERTUSSIS 5-2.5-18.5 LF-MCG/0.5 IM SUSP: 0.5000 mL | Freq: Once | INTRAMUSCULAR | Status: DC

## 2013-05-08 NOTE — ED Provider Notes (Signed)
CSN: [id number]     Arrival date & time 05/08/13  1543 History   First MD Initiated Contact with Patient 05/08/13 1615     Chief Complaint  Patient presents with  . Foot Injury    Pt. reports she stepped on a nail this am and she took her shoe off and the nail came out of the foot with the shoe.     (Consider location/radiation/quality/duration/timing/severity/associated sxs/prior Treatment) Patient is a 22 y.o. female presenting with foot injury. The history is provided by the patient. No language interpreter was used.  Foot Injury Location:  Foot Injury: yes   Foot location:  L foot Pain details:    Quality:  Dull   Radiates to:  Does not radiate   Severity:  Mild   Timing:  Constant Foreign body present:  No foreign bodies Tetanus status:  Out of date Relieved by:  Nothing Worsened by:  Nothing tried Associated symptoms: no swelling   Pt reports she stepped on a nail.   Pt does not know the date of her last tetanus  Past Medical History  Diagnosis Date  . No pertinent past medical history    Past Surgical History  Procedure Laterality Date  . Cesarean section  04/29/2011    Procedure: CESAREAN SECTION;  Surgeon: Roseanna Rainbow, MD;  Location: [facility];  Service: Gynecology;  Laterality: N/A;  Primary Cesarian Section   No family history on file. History  Substance Use Topics  . Smoking status: Former Smoker -- 0.25 packs/day    Types: Cigarettes    Quit date: 12/01/2012  . Smokeless tobacco: Never Used  . Alcohol Use: No   OB History   Grav Para Term Preterm Abortions TAB SAB Ect Mult Living   3 1 1  1  1   1      Review of Systems  All other systems reviewed and are negative.    Allergies  Review of patient's allergies indicates no known allergies.  Home Medications   Current Outpatient Rx  Name  Route  Sig  Dispense  Refill  . ondansetron (ZOFRAN ODT) 4 MG disintegrating tablet   Oral   Take 1 tablet (4 mg total) by mouth every 8 (eight) hours  as needed for nausea.   10 tablet   0   . Prenatal Vit-Fe Fumarate-FA (PRENATAL COMPLETE) 14-0.4 MG TABS   Oral   Take 1 tablet by mouth daily.   60 each   3    BP 139/56  Pulse 107  Temp(Src) 98.6 F (37 C) (Oral)  Resp 18  Ht 5\' 3"  (1.6 m)  Wt 220 lb (99.791 kg)  BMI 38.98 kg/m2  SpO2 100% Physical Exam  Nursing note and vitals reviewed. Constitutional: She is oriented to person, place, and time. She appears well-developed.  HENT:  Head: Normocephalic.  Eyes: Pupils are equal, round, and reactive to light.  Neck: Normal range of motion.  Musculoskeletal:  punture wound left foot  Appears superficial  Neurological: She is alert and oriented to person, place, and time. She has normal reflexes.  Skin: Skin is warm.  Psychiatric: She has a normal mood and affect.    ED Course  Procedures (including critical care time) Labs Review Labs Reviewed - No data to display Imaging Review No results found.  EKG Interpretation   None       MDM   1. Puncture wound of left foot, initial encounter    Prenatal records show tetanus is up  to date    Elson Areas, PA-C 05/08/13 1701  Lonia Skinner Capulin, PA-C 05/08/13 [phone]

## 2013-05-08 NOTE — ED Provider Notes (Signed)
Medical screening examination/treatment/procedure(s) were performed by non-physician practitioner and as supervising physician I was immediately available for consultation/collaboration.  EKG Interpretation   None         Candyce Churn, MD 05/08/13 [phone]

## 2013-05-08 NOTE — ED Notes (Signed)
Pt. Reports she stepped on a nail outside her home today..  Pt. Reports she is 7 mths pregnant and unsure about her tetanus shot.

## 2013-05-17 ENCOUNTER — Encounter: Payer: Self-pay | Admitting: Advanced Practice Midwife

## 2013-05-17 ENCOUNTER — Ambulatory Visit (INDEPENDENT_AMBULATORY_CARE_PROVIDER_SITE_OTHER): Payer: Medicaid Other | Admitting: Advanced Practice Midwife

## 2013-05-17 VITALS — BP 122/73 | Temp 97.2°F | Wt 222.0 lb

## 2013-05-17 DIAGNOSIS — O0930 Supervision of pregnancy with insufficient antenatal care, unspecified trimester: Secondary | ICD-10-CM

## 2013-05-17 DIAGNOSIS — J00 Acute nasopharyngitis [common cold]: Secondary | ICD-10-CM

## 2013-05-17 DIAGNOSIS — O34219 Maternal care for unspecified type scar from previous cesarean delivery: Secondary | ICD-10-CM

## 2013-05-17 DIAGNOSIS — IMO0001 Reserved for inherently not codable concepts without codable children: Secondary | ICD-10-CM

## 2013-05-17 LAB — POCT URINALYSIS DIP (DEVICE)
Glucose, UA: NEGATIVE mg/dL
Hgb urine dipstick: NEGATIVE
Nitrite: NEGATIVE
Protein, ur: NEGATIVE mg/dL
Specific Gravity, Urine: 1.025 (ref 1.005–1.030)
Urobilinogen, UA: 0.2 mg/dL (ref 0.0–1.0)
pH: 6.5 (ref 5.0–8.0)

## 2013-05-17 NOTE — Patient Instructions (Signed)

## 2013-05-17 NOTE — Progress Notes (Signed)
Pulse: 104 Patient has a cold.

## 2013-05-17 NOTE — Progress Notes (Signed)
Doing well except for cold symptoms. Reviewed cold care, Mucinex and sudafed.  May use cough drops

## 2013-06-02 ENCOUNTER — Encounter: Payer: Medicaid Other | Admitting: Advanced Practice Midwife

## 2013-06-15 NOTE — L&D Delivery Note (Signed)
Delivery Note At 3:34 AM a viable and healthy female was delivered via Vaginal, Spontaneous Delivery (Presentation: Left Occiput Anterior).  APGAR: 6, 9; weight TBD.   Placenta status: Intact, Spontaneous.  Cord: 3 vessels with the following complications: None.  Cord pH: n/a  Mom pushed briefly with good maternal effort. FOB at bedside. Baby came to crowning with a cough and was quickly delivered. Spontaneous cry. Good tone with stimulation.   Anesthesia: Epidural  Episiotomy: None Lacerations: 2nd degree Suture Repair: 3.0 vicryl Est. Blood Loss (mL): 500  Mom to postpartum.  Baby to Couplet care / Skin to Skin.  Quincy SimmondsFeeney, Patricia L 07/18/2013, 4:14 AM  I was present for and supervised the delivery of this newborn. I agree with above documentation.   Rulon AbideKeli Birdie Beveridge, M.D. [facility] 07/18/2013 7:56 AM

## 2013-06-20 ENCOUNTER — Encounter: Payer: Medicaid Other | Admitting: Advanced Practice Midwife

## 2013-07-05 ENCOUNTER — Ambulatory Visit (INDEPENDENT_AMBULATORY_CARE_PROVIDER_SITE_OTHER): Payer: Medicaid Other | Admitting: Advanced Practice Midwife

## 2013-07-05 VITALS — BP 126/84 | Temp 98.4°F | Wt 236.5 lb

## 2013-07-05 DIAGNOSIS — O0930 Supervision of pregnancy with insufficient antenatal care, unspecified trimester: Secondary | ICD-10-CM

## 2013-07-05 DIAGNOSIS — O34219 Maternal care for unspecified type scar from previous cesarean delivery: Secondary | ICD-10-CM

## 2013-07-05 LAB — POCT URINALYSIS DIP (DEVICE)
BILIRUBIN URINE: NEGATIVE
GLUCOSE, UA: NEGATIVE mg/dL
Hgb urine dipstick: NEGATIVE
Nitrite: NEGATIVE
Protein, ur: 30 mg/dL — AB
Urobilinogen, UA: 0.2 mg/dL (ref 0.0–1.0)
pH: 5.5 (ref 5.0–8.0)

## 2013-07-05 LAB — OB RESULTS CONSOLE GBS: GBS: POSITIVE

## 2013-07-05 LAB — OB RESULTS CONSOLE GC/CHLAMYDIA
Chlamydia: NEGATIVE
GC PROBE AMP, GENITAL: NEGATIVE

## 2013-07-05 NOTE — Progress Notes (Signed)
Discussed previous C/S. States got to pushing stage and head would not come under bone. Weight 7lbs. Wants to try TOLAC. Consent signed today. Missed last [place] visits due to [place] in [place] GBS and cultures done

## 2013-07-05 NOTE — Patient Instructions (Signed)
Third Trimester of Pregnancy  The third trimester is from week 29 through week 42, months 7 through 9. The third trimester is a time when the fetus is growing rapidly. At the end of the ninth month, the fetus is about 20 inches in length and weighs 6 10 pounds.   BODY CHANGES  Your body goes through many changes during pregnancy. The changes vary from woman to woman.    Your weight will continue to increase. You can expect to gain 25 35 pounds (11 16 kg) by the end of the pregnancy.   You may begin to get stretch marks on your hips, abdomen, and breasts.   You may urinate more often because the fetus is moving lower into your pelvis and pressing on your bladder.   You may develop or continue to have heartburn as a result of your pregnancy.   You may develop constipation because certain hormones are causing the muscles that push waste through your intestines to slow down.   You may develop hemorrhoids or swollen, bulging veins (varicose veins).   You may have pelvic pain because of the weight gain and pregnancy hormones relaxing your joints between the bones in your pelvis. Back aches may result from over exertion of the muscles supporting your posture.   Your breasts will continue to grow and be tender. A yellow discharge may leak from your breasts called colostrum.   Your belly button may stick out.   You may feel short of breath because of your expanding uterus.   You may notice the fetus "dropping," or moving lower in your abdomen.   You may have a bloody mucus discharge. This usually occurs a few days to a week before labor begins.   Your cervix becomes thin and soft (effaced) near your due date.  WHAT TO EXPECT AT YOUR PRENATAL EXAMS   You will have prenatal exams every 2 weeks until week 36. Then, you will have weekly prenatal exams. During a routine prenatal visit:   You will be weighed to make sure you and the fetus are growing normally.   Your blood pressure is taken.   Your abdomen will be  measured to track your baby's growth.   The fetal heartbeat will be listened to.   Any test results from the previous visit will be discussed.   You may have a cervical check near your due date to see if you have effaced.  At around 36 weeks, your caregiver will check your cervix. At the same time, your caregiver will also perform a test on the secretions of the vaginal tissue. This test is to determine if a type of bacteria, Group B streptococcus, is present. Your caregiver will explain this further.  Your caregiver may ask you:   What your birth plan is.   How you are feeling.   If you are feeling the baby move.   If you have had any abnormal symptoms, such as leaking fluid, bleeding, severe headaches, or abdominal cramping.   If you have any questions.  Other tests or screenings that may be performed during your third trimester include:   Blood tests that check for low iron levels (anemia).   Fetal testing to check the health, activity level, and growth of the fetus. Testing is done if you have certain medical conditions or if there are problems during the pregnancy.  FALSE LABOR  You may feel small, irregular contractions that eventually go away. These are called Braxton Hicks contractions, or   false labor. Contractions may last for hours, days, or even weeks before true labor sets in. If contractions come at regular intervals, intensify, or become painful, it is best to be seen by your caregiver.   SIGNS OF LABOR    Menstrual-like cramps.   Contractions that are 5 minutes apart or less.   Contractions that start on the top of the uterus and spread down to the lower abdomen and back.   A sense of increased pelvic pressure or back pain.   A watery or bloody mucus discharge that comes from the vagina.  If you have any of these signs before the 37th week of pregnancy, call your caregiver right away. You need to go to the hospital to get checked immediately.  HOME CARE INSTRUCTIONS    Avoid all  smoking, herbs, alcohol, and unprescribed drugs. These chemicals affect the formation and growth of the baby.   Follow your caregiver's instructions regarding medicine use. There are medicines that are either safe or unsafe to take during pregnancy.   Exercise only as directed by your caregiver. Experiencing uterine cramps is a good sign to stop exercising.   Continue to eat regular, healthy meals.   Wear a good support bra for breast tenderness.   Do not use hot tubs, steam rooms, or saunas.   Wear your seat belt at all times when driving.   Avoid raw meat, uncooked cheese, cat litter boxes, and soil used by cats. These carry germs that can cause birth defects in the baby.   Take your prenatal vitamins.   Try taking a stool softener (if your caregiver approves) if you develop constipation. Eat more high-fiber foods, such as fresh vegetables or fruit and whole grains. Drink plenty of fluids to keep your urine clear or pale yellow.   Take warm sitz baths to soothe any pain or discomfort caused by hemorrhoids. Use hemorrhoid cream if your caregiver approves.   If you develop varicose veins, wear support hose. Elevate your feet for 15 minutes, 3 4 times a day. Limit salt in your diet.   Avoid heavy lifting, wear low heal shoes, and practice good posture.   Rest a lot with your legs elevated if you have leg cramps or low back pain.   Visit your dentist if you have not gone during your pregnancy. Use a soft toothbrush to brush your teeth and be gentle when you floss.   A sexual relationship may be continued unless your caregiver directs you otherwise.   Do not travel far distances unless it is absolutely necessary and only with the approval of your caregiver.   Take prenatal classes to understand, practice, and ask questions about the labor and delivery.   Make a trial run to the hospital.   Pack your hospital bag.   Prepare the baby's nursery.   Continue to go to all your prenatal visits as directed  by your caregiver.  SEEK MEDICAL CARE IF:   You are unsure if you are in labor or if your water has broken.   You have dizziness.   You have mild pelvic cramps, pelvic pressure, or nagging pain in your abdominal area.   You have persistent nausea, vomiting, or diarrhea.   You have a bad smelling vaginal discharge.   You have pain with urination.  SEEK IMMEDIATE MEDICAL CARE IF:    You have a fever.   You are leaking fluid from your vagina.   You have spotting or bleeding from your vagina.     You have severe abdominal cramping or pain.   You have rapid weight loss or gain.   You have shortness of breath with chest pain.   You notice sudden or extreme swelling of your face, hands, ankles, feet, or legs.   You have not felt your baby move in over an hour.   You have severe headaches that do not go away with medicine.   You have vision changes.  Document Released: 05/26/2001 Document Revised: 02/01/2013 Document Reviewed: 08/02/2012  [employer] Patient Information 2014 [employer]

## 2013-07-05 NOTE — Progress Notes (Signed)
P=84,

## 2013-07-06 LAB — GC/CHLAMYDIA PROBE AMP
CT Probe RNA: NEGATIVE
GC PROBE AMP APTIMA: NEGATIVE

## 2013-07-08 LAB — CULTURE, BETA STREP (GROUP B ONLY)

## 2013-07-10 ENCOUNTER — Encounter: Payer: Self-pay | Admitting: Advanced Practice Midwife

## 2013-07-10 DIAGNOSIS — A491 Streptococcal infection, unspecified site: Secondary | ICD-10-CM | POA: Insufficient documentation

## 2013-07-12 ENCOUNTER — Telehealth (HOSPITAL_COMMUNITY): Payer: Self-pay | Admitting: *Deleted

## 2013-07-12 ENCOUNTER — Ambulatory Visit (INDEPENDENT_AMBULATORY_CARE_PROVIDER_SITE_OTHER): Payer: Self-pay | Admitting: Family Medicine

## 2013-07-12 ENCOUNTER — Encounter: Payer: Self-pay | Admitting: Family Medicine

## 2013-07-12 VITALS — BP 136/82 | Wt 236.9 lb

## 2013-07-12 DIAGNOSIS — O34219 Maternal care for unspecified type scar from previous cesarean delivery: Secondary | ICD-10-CM

## 2013-07-12 DIAGNOSIS — O0930 Supervision of pregnancy with insufficient antenatal care, unspecified trimester: Secondary | ICD-10-CM

## 2013-07-12 LAB — POCT URINALYSIS DIP (DEVICE)
GLUCOSE, UA: NEGATIVE mg/dL
HGB URINE DIPSTICK: NEGATIVE
Ketones, ur: NEGATIVE mg/dL
NITRITE: NEGATIVE
Protein, ur: NEGATIVE mg/dL
Specific Gravity, Urine: >=1.03 (ref 1.005–1.030)
UROBILINOGEN UA: 0.2 mg/dL (ref 0.0–1.0)
pH: 6 (ref 5.0–8.0)

## 2013-07-12 NOTE — Progress Notes (Signed)
CTX at night, no lof, no vb, +FM  Meredith [name] is a 23 y.o. G3P1011 at 920w0d here for ROB visit.    Discussed with Patient:  - Plans to breast feed.  All questions answered. - Continue prenatal vitamins. - Reviewed fetal kick counts Pt to perform daily at a time when the baby is active, lie laterally with both hands on belly in quiet room and count all movements (hiccups, shoulder rolls, obvious kicks, etc); pt is to report to clinic MAU for less than 10 movements felt in a 2 hour time period-pt told as soon as she counts 10 movements the count is complete.  - Routine precautions discussed (depression, infection s/s).   Patient provided with all pertinent phone numbers for emergencies. - RTC for any VB, regular, painful cramps/ctxs occurring at a rate of >2/10 min, fever (100.5 or higher), n/v/d, any pain that is unresolving or worsening, LOF, decreased fetal movement, CP, SOB, edema - schedule for induction of labor today  Problems: Patient Active Problem List   Diagnosis Date Noted  . Group B streptococcal infection 07/10/2013  . Late prenatal care complicating pregnancy 03/31/2013  . History of cesarean delivery, currently pregnant 03/31/2013    To Do: 1.   [ ]  Vaccines: recd [ ]  BCM: mirena [ ]  Readiness: baby has a place to sleep, car seat, other baby necessities.Mother at home for support  Edu: [ ]  PTL precautions; [ ]  BF class; [ ]  childbirth class; [ ]   BF counseling;  Scheduled for IOL PD on 19 Jul 728

## 2013-07-12 NOTE — Patient Instructions (Signed)
Third Trimester of Pregnancy  The third trimester is from week 29 through week 42, months 7 through 9. The third trimester is a time when the fetus is growing rapidly. At the end of the ninth month, the fetus is about 20 inches in length and weighs 6 10 pounds.   BODY CHANGES  Your body goes through many changes during pregnancy. The changes vary from woman to woman.    Your weight will continue to increase. You can expect to gain 25 35 pounds (11 16 kg) by the end of the pregnancy.   You may begin to get stretch marks on your hips, abdomen, and breasts.   You may urinate more often because the fetus is moving lower into your pelvis and pressing on your bladder.   You may develop or continue to have heartburn as a result of your pregnancy.   You may develop constipation because certain hormones are causing the muscles that push waste through your intestines to slow down.   You may develop hemorrhoids or swollen, bulging veins (varicose veins).   You may have pelvic pain because of the weight gain and pregnancy hormones relaxing your joints between the bones in your pelvis. Back aches may result from over exertion of the muscles supporting your posture.   Your breasts will continue to grow and be tender. A yellow discharge may leak from your breasts called colostrum.   Your belly button may stick out.   You may feel short of breath because of your expanding uterus.   You may notice the fetus "dropping," or moving lower in your abdomen.   You may have a bloody mucus discharge. This usually occurs a few days to a week before labor begins.   Your cervix becomes thin and soft (effaced) near your due date.  WHAT TO EXPECT AT YOUR PRENATAL EXAMS   You will have prenatal exams every 2 weeks until week 36. Then, you will have weekly prenatal exams. During a routine prenatal visit:   You will be weighed to make sure you and the fetus are growing normally.   Your blood pressure is taken.   Your abdomen will be  measured to track your baby's growth.   The fetal heartbeat will be listened to.   Any test results from the previous visit will be discussed.   You may have a cervical check near your due date to see if you have effaced.  At around 36 weeks, your caregiver will check your cervix. At the same time, your caregiver will also perform a test on the secretions of the vaginal tissue. This test is to determine if a type of bacteria, Group B streptococcus, is present. Your caregiver will explain this further.  Your caregiver may ask you:   What your birth plan is.   How you are feeling.   If you are feeling the baby move.   If you have had any abnormal symptoms, such as leaking fluid, bleeding, severe headaches, or abdominal cramping.   If you have any questions.  Other tests or screenings that may be performed during your third trimester include:   Blood tests that check for low iron levels (anemia).   Fetal testing to check the health, activity level, and growth of the fetus. Testing is done if you have certain medical conditions or if there are problems during the pregnancy.  FALSE LABOR  You may feel small, irregular contractions that eventually go away. These are called Braxton Hicks contractions, or   false labor. Contractions may last for hours, days, or even weeks before true labor sets in. If contractions come at regular intervals, intensify, or become painful, it is best to be seen by your caregiver.   SIGNS OF LABOR    Menstrual-like cramps.   Contractions that are 5 minutes apart or less.   Contractions that start on the top of the uterus and spread down to the lower abdomen and back.   A sense of increased pelvic pressure or back pain.   A watery or bloody mucus discharge that comes from the vagina.  If you have any of these signs before the 37th week of pregnancy, call your caregiver right away. You need to go to the hospital to get checked immediately.  HOME CARE INSTRUCTIONS    Avoid all  smoking, herbs, alcohol, and unprescribed drugs. These chemicals affect the formation and growth of the baby.   Follow your caregiver's instructions regarding medicine use. There are medicines that are either safe or unsafe to take during pregnancy.   Exercise only as directed by your caregiver. Experiencing uterine cramps is a good sign to stop exercising.   Continue to eat regular, healthy meals.   Wear a good support bra for breast tenderness.   Do not use hot tubs, steam rooms, or saunas.   Wear your seat belt at all times when driving.   Avoid raw meat, uncooked cheese, cat litter boxes, and soil used by cats. These carry germs that can cause birth defects in the baby.   Take your prenatal vitamins.   Try taking a stool softener (if your caregiver approves) if you develop constipation. Eat more high-fiber foods, such as fresh vegetables or fruit and whole grains. Drink plenty of fluids to keep your urine clear or pale yellow.   Take warm sitz baths to soothe any pain or discomfort caused by hemorrhoids. Use hemorrhoid cream if your caregiver approves.   If you develop varicose veins, wear support hose. Elevate your feet for 15 minutes, 3 4 times a day. Limit salt in your diet.   Avoid heavy lifting, wear low heal shoes, and practice good posture.   Rest a lot with your legs elevated if you have leg cramps or low back pain.   Visit your dentist if you have not gone during your pregnancy. Use a soft toothbrush to brush your teeth and be gentle when you floss.   A sexual relationship may be continued unless your caregiver directs you otherwise.   Do not travel far distances unless it is absolutely necessary and only with the approval of your caregiver.   Take prenatal classes to understand, practice, and ask questions about the labor and delivery.   Make a trial run to the hospital.   Pack your hospital bag.   Prepare the baby's nursery.   Continue to go to all your prenatal visits as directed  by your caregiver.  SEEK MEDICAL CARE IF:   You are unsure if you are in labor or if your water has broken.   You have dizziness.   You have mild pelvic cramps, pelvic pressure, or nagging pain in your abdominal area.   You have persistent nausea, vomiting, or diarrhea.   You have a bad smelling vaginal discharge.   You have pain with urination.  SEEK IMMEDIATE MEDICAL CARE IF:    You have a fever.   You are leaking fluid from your vagina.   You have spotting or bleeding from your vagina.     You have severe abdominal cramping or pain.   You have rapid weight loss or gain.   You have shortness of breath with chest pain.   You notice sudden or extreme swelling of your face, hands, ankles, feet, or legs.   You have not felt your baby move in over an hour.   You have severe headaches that do not go away with medicine.   You have vision changes.  Document Released: 05/26/2001 Document Revised: 02/01/2013 Document Reviewed: 08/02/2012  [employer] Patient Information 2014 [employer]

## 2013-07-12 NOTE — Progress Notes (Signed)
Patient is doing well.  No complaints.  +FM, + contractions, no lof or vaginal bleeding/discharge.  [facility]

## 2013-07-12 NOTE — Telephone Encounter (Signed)
Preadmission screen  

## 2013-07-12 NOTE — Progress Notes (Signed)
Pulse: 86

## 2013-07-17 ENCOUNTER — Inpatient Hospital Stay (HOSPITAL_COMMUNITY)
Admission: AD | Admit: 2013-07-17 | Discharge: 2013-07-19 | DRG: 774 | Disposition: A | Discharge status: Home / Self Care | Payer: Medicaid Other | Source: Ambulatory Visit | Attending: Obstetrics & Gynecology | Admitting: Obstetrics & Gynecology

## 2013-07-17 ENCOUNTER — Encounter (HOSPITAL_COMMUNITY): Payer: Self-pay | Admitting: Obstetrics & Gynecology

## 2013-07-17 ENCOUNTER — Inpatient Hospital Stay (HOSPITAL_COMMUNITY): Payer: Medicaid Other | Admitting: Anesthesiology

## 2013-07-17 ENCOUNTER — Encounter (HOSPITAL_COMMUNITY): Payer: Medicaid Other | Admitting: Anesthesiology

## 2013-07-17 DIAGNOSIS — A491 Streptococcal infection, unspecified site: Secondary | ICD-10-CM

## 2013-07-17 DIAGNOSIS — O99214 Obesity complicating childbirth: Secondary | ICD-10-CM

## 2013-07-17 DIAGNOSIS — O9902 Anemia complicating childbirth: Secondary | ICD-10-CM | POA: Diagnosis present

## 2013-07-17 DIAGNOSIS — O0930 Supervision of pregnancy with insufficient antenatal care, unspecified trimester: Secondary | ICD-10-CM

## 2013-07-17 DIAGNOSIS — O480 Post-term pregnancy: Secondary | ICD-10-CM | POA: Diagnosis present

## 2013-07-17 DIAGNOSIS — O9989 Other specified diseases and conditions complicating pregnancy, childbirth and the puerperium: Secondary | ICD-10-CM

## 2013-07-17 DIAGNOSIS — IMO0002 Reserved for concepts with insufficient information to code with codable children: Secondary | ICD-10-CM | POA: Diagnosis present

## 2013-07-17 DIAGNOSIS — Z87891 Personal history of nicotine dependence: Secondary | ICD-10-CM

## 2013-07-17 DIAGNOSIS — D649 Anemia, unspecified: Secondary | ICD-10-CM | POA: Diagnosis present

## 2013-07-17 DIAGNOSIS — Z22330 Carrier of Group B streptococcus: Secondary | ICD-10-CM

## 2013-07-17 DIAGNOSIS — O358XX0 Maternal care for other (suspected) fetal abnormality and damage, not applicable or unspecified: Secondary | ICD-10-CM | POA: Diagnosis present

## 2013-07-17 DIAGNOSIS — IMO0001 Reserved for inherently not codable concepts without codable children: Secondary | ICD-10-CM

## 2013-07-17 DIAGNOSIS — O34219 Maternal care for unspecified type scar from previous cesarean delivery: Secondary | ICD-10-CM | POA: Diagnosis not present

## 2013-07-17 DIAGNOSIS — O99892 Other specified diseases and conditions complicating childbirth: Secondary | ICD-10-CM | POA: Diagnosis present

## 2013-07-17 DIAGNOSIS — E669 Obesity, unspecified: Secondary | ICD-10-CM | POA: Diagnosis present

## 2013-07-17 LAB — CBC
HCT: 30.9 % — ABNORMAL LOW (ref 36.0–46.0)
Hemoglobin: 9.7 g/dL — ABNORMAL LOW (ref 12.0–15.0)
MCH: 23.1 pg — ABNORMAL LOW (ref 26.0–34.0)
MCHC: 31.4 g/dL (ref 30.0–36.0)
MCV: 73.6 fL — ABNORMAL LOW (ref 78.0–100.0)
PLATELETS: 228 10*3/uL (ref 150–400)
RBC: 4.2 MIL/uL (ref 3.87–5.11)
RDW: 15.6 % — ABNORMAL HIGH (ref 11.5–15.5)
WBC: 9.8 10*3/uL (ref 4.0–10.5)

## 2013-07-17 LAB — TYPE AND SCREEN
ABO/RH(D): O POS
ANTIBODY SCREEN: NEGATIVE

## 2013-07-17 LAB — ABO/RH: ABO/RH(D): O POS

## 2013-07-17 MED ORDER — PHENYLEPHRINE 40 MCG/ML (10ML) SYRINGE FOR IV PUSH (FOR BLOOD PRESSURE SUPPORT)
80.0000 ug | PREFILLED_SYRINGE | INTRAVENOUS | Status: DC | PRN
  Filled 2013-07-17: qty 10
  Filled 2013-07-17: qty 2

## 2013-07-17 MED ORDER — ONDANSETRON HCL 4 MG/2ML IJ SOLN: 4.0000 mg | Freq: Four times a day (QID) | INTRAMUSCULAR | Status: DC | PRN

## 2013-07-17 MED ORDER — LIDOCAINE HCL (PF) 1 % IJ SOLN
30.0000 mL | INTRAMUSCULAR | Status: DC | PRN
  Administered 2013-07-18: 30 mL via SUBCUTANEOUS
  Filled 2013-07-17 (×2): qty 30

## 2013-07-17 MED ORDER — FENTANYL 2.5 MCG/ML BUPIVACAINE 1/10 % EPIDURAL INFUSION (WH - ANES)
14.0000 mL/h | INTRAMUSCULAR | Status: DC | PRN
  Filled 2013-07-17: qty 125

## 2013-07-17 MED ORDER — CITRIC ACID-SODIUM CITRATE 334-500 MG/5ML PO SOLN
30.0000 mL | ORAL | Status: DC | PRN
Start: 2013-07-17 — End: 2013-07-18

## 2013-07-17 MED ORDER — ACETAMINOPHEN 325 MG PO TABS: 650.0000 mg | ORAL_TABLET | ORAL | Status: DC | PRN

## 2013-07-17 MED ORDER — OXYTOCIN 40 UNITS IN LACTATED RINGERS INFUSION - SIMPLE MED
62.5000 mL/h | INTRAVENOUS | Status: DC
  Filled 2013-07-17 (×2): qty 1000

## 2013-07-17 MED ORDER — LACTATED RINGERS IV SOLN
INTRAVENOUS | Status: DC
  Administered 2013-07-17 – 2013-07-18 (×3): via INTRAVENOUS

## 2013-07-17 MED ORDER — OXYCODONE-ACETAMINOPHEN 5-325 MG PO TABS: 1.0000 | ORAL_TABLET | ORAL | Status: DC | PRN

## 2013-07-17 MED ORDER — LACTATED RINGERS IV SOLN: 500.0000 mL | Freq: Once | INTRAVENOUS | Status: DC

## 2013-07-17 MED ORDER — TERBUTALINE SULFATE 1 MG/ML IJ SOLN: 0.2500 mg | Freq: Once | INTRAMUSCULAR | Status: AC | PRN

## 2013-07-17 MED ORDER — IBUPROFEN 600 MG PO TABS: 600.0000 mg | ORAL_TABLET | Freq: Four times a day (QID) | ORAL | Status: DC | PRN

## 2013-07-17 MED ORDER — LIDOCAINE HCL (PF) 1 % IJ SOLN
INTRAMUSCULAR | Status: DC | PRN
  Administered 2013-07-17 (×2): 4 mL

## 2013-07-17 MED ORDER — EPHEDRINE 5 MG/ML INJ
10.0000 mg | INTRAVENOUS | Status: DC | PRN
  Filled 2013-07-17: qty 2

## 2013-07-17 MED ORDER — OXYTOCIN BOLUS FROM INFUSION: 500.0000 mL | INTRAVENOUS | Status: DC

## 2013-07-17 MED ORDER — PHENYLEPHRINE 40 MCG/ML (10ML) SYRINGE FOR IV PUSH (FOR BLOOD PRESSURE SUPPORT)
80.0000 ug | PREFILLED_SYRINGE | INTRAVENOUS | Status: DC | PRN
  Filled 2013-07-17: qty 2

## 2013-07-17 MED ORDER — FENTANYL 2.5 MCG/ML BUPIVACAINE 1/10 % EPIDURAL INFUSION (WH - ANES)
INTRAMUSCULAR | Status: DC | PRN
  Administered 2013-07-17: 14 mL/h via EPIDURAL

## 2013-07-17 MED ORDER — EPHEDRINE 5 MG/ML INJ
10.0000 mg | INTRAVENOUS | Status: DC | PRN
  Filled 2013-07-17: qty 2
  Filled 2013-07-17: qty 4

## 2013-07-17 MED ORDER — OXYTOCIN 40 UNITS IN LACTATED RINGERS INFUSION - SIMPLE MED: 62.5000 mL/h | INTRAVENOUS | Status: DC

## 2013-07-17 MED ORDER — BUTORPHANOL TARTRATE 1 MG/ML IJ SOLN: 1.0000 mg | INTRAMUSCULAR | Status: DC | PRN

## 2013-07-17 MED ORDER — LACTATED RINGERS IV SOLN: 500.0000 mL | INTRAVENOUS | Status: DC | PRN

## 2013-07-17 MED ORDER — PENICILLIN G POTASSIUM 5000000 UNITS IJ SOLR
2.5000 10*6.[IU] | INTRAVENOUS | Status: DC
  Administered 2013-07-17: 2.5 10*6.[IU] via INTRAVENOUS
  Filled 2013-07-17 (×5): qty 2.5

## 2013-07-17 MED ORDER — PENICILLIN G POTASSIUM 5000000 UNITS IJ SOLR
5.0000 10*6.[IU] | Freq: Once | INTRAVENOUS | Status: AC
  Administered 2013-07-17: 5 10*6.[IU] via INTRAVENOUS
  Filled 2013-07-17: qty 5

## 2013-07-17 MED ORDER — OXYTOCIN 40 UNITS IN LACTATED RINGERS INFUSION - SIMPLE MED
1.0000 m[IU]/min | INTRAVENOUS | Status: DC
  Administered 2013-07-17: 2 m[IU]/min via INTRAVENOUS

## 2013-07-17 MED ORDER — DIPHENHYDRAMINE HCL 50 MG/ML IJ SOLN: 12.5000 mg | INTRAMUSCULAR | Status: DC | PRN

## 2013-07-17 NOTE — MAU Note (Signed)
Pt to room 162 for triage.

## 2013-07-17 NOTE — Progress Notes (Signed)
Meredith [name] is a 23 y.o. G3P1011 at 2767w5d admitted for induction of labor due to postdates and hx of previous c/s.    Subjective:  Pt starting to feel contractions more. She is overall not too uncomfortable yet. +FM  Objective: BP 139/98  Pulse 83  Temp(Src) 98.6 F (37 C) (Oral)  Resp 16  Ht 5\' 3"  (1.6 m)  Wt 107.049 kg (236 lb)  BMI 41.82 kg/m2      FHT:  FHR: 140 bpm, variability: moderate,  accelerations:  Present,  decelerations:  Absent UC:   irregular, every 2-5 minutes SVE:   Dilation: 4 Effacement (%): 70 Exam by:: Auriel RN   Labs: Lab Results  Component Value Date   WBC 9.8 07/17/2013   HGB 9.7* 07/17/2013   HCT 30.9* 07/17/2013   MCV 73.6* 07/17/2013   PLT 228 07/17/2013    Assessment / Plan: IOL due to postdates. still in early labor  Labor: on pitocin, up to 255mu/min. starting to become more uncomfortable. cont to increase Elevated bP x2:  will send r/o labs  Fetal Wellbeing:  Category I Pain Control:  Labor support without medications I/D:  positive- on PCN Anticipated MOD:  NSVD  Gregg Winchell L 07/17/2013, 10:57 PM

## 2013-07-17 NOTE — Anesthesia Procedure Notes (Signed)
Epidural Patient location during procedure: OB Start time: 07/17/2013 11:49 PM  Staffing Anesthesiologist: Jahleel Stroschein A. Performed by: anesthesiologist   Preanesthetic Checklist Completed: patient identified, site marked, surgical consent, pre-op evaluation, timeout performed, IV checked, risks and benefits discussed and monitors and equipment checked  Epidural Patient position: sitting Prep: site prepped and draped and DuraPrep Patient monitoring: continuous pulse ox and blood pressure Approach: midline Injection technique: LOR air  Needle:  Needle type: Tuohy  Needle gauge: 17 G Needle length: 9 cm and 9 Needle insertion depth: 6 cm Catheter type: closed end flexible Catheter size: 19 Gauge Catheter at skin depth: 11 cm Test dose: negative and Other  Assessment Events: blood not aspirated, injection not painful, no injection resistance, negative IV test and no paresthesia  Additional Notes Patient identified. Risks and benefits discussed including failed block, incomplete  Pain control, post dural puncture headache, nerve damage, paralysis, blood pressure Changes, nausea, vomiting, reactions to medications-both toxic and allergic and post Partum back pain. All questions were answered. Patient expressed understanding and wished to proceed. Sterile technique was used throughout procedure. Epidural site was Dressed with sterile barrier dressing. No paresthesias, signs of intravascular injection Or signs of intrathecal spread were encountered.  Patient was more comfortable after the epidural was dosed. Please see RN's note for documentation of vital signs and FHR which are stable.

## 2013-07-17 NOTE — H&P (Signed)
  Subjective:  Meredith [name] is a 23 y.o. G3 43P1011 female with EDC1/28/2014 at 1740 and 5/[redacted] weeks gestation who is being admitted for labor augmentation.  Her current obstetrical history is significant for prior c-section.  Patient reports reg contractions.   Fetal Movement: normal.  No LOF or Vaginal bleeding    Objective:  Pt in NAD Vital signs in last 24 hours: Temp:  [98.4 F (36.9 C)] 98.4 F (36.9 C) (02/02 1635) Pulse Rate:  [92] 92 (02/02 1635) Resp:  [20] 20 (02/02 1635) BP: (137)/(82) 137/82 mmHg (02/02 1635)   General:   alert and no distress  Skin:   normal  HEENT:   Lungs:   clear to auscultation bilaterally  Heart:   regular rate and rhythm, S1, S2 normal, no murmur, click, rub or gallop  Breasts:    Abdomen:  soft, non-tender; bowel sounds normal; no masses,  no organomegaly gravid  Pelvis:  Vulva and vagina appear normal. Bimanual exam reveals normal uterus and adnexa. cervix 4-5cm/60%/vertex/ BOWI /-2 station   FHT: 140's Cat I  Uterine Size:   Presentations: cephalic  Cervix:    Dilation: 4-5cm   Effacement: 60%    Station:  -2   Consistency: soft   Position: middle   Lab Review  O, Rh+, GBS positive  AFP:did not  Receive; too late  One hour GTT: Normal   RPR: neg Hep B- neg HIV: NR   Prenatal Transfer Tool  Maternal Diabetes: No Genetic Screening: Declined Maternal Ultrasounds/Referrals: Abnormal:  Findings:   Fetal renal pyelectasis Fetal Ultrasounds or other Referrals:  None Maternal Substance Abuse:  No Significant Maternal Medications:  None Significant Maternal Lab Results: None     Assessment/Plan:  40 and 5/[redacted] weeks gestation. Early latent labor. Obstetrical history significant for prior c-section.     Risks, benefits, alternatives and possible complications have been discussed in detail with the patient.  Pre-admission, admission, and post admission procedures and expectations were discussed in detail.  All questions answered, all  appropriate consents will be signed at the Hospital. Admission is planned for today.  Anticipate vaginal delivery. and Augmentation: IV Pitocin augmentation.

## 2013-07-17 NOTE — Anesthesia Preprocedure Evaluation (Addendum)
Anesthesia Evaluation  Patient identified by MRN, date of birth, ID band Patient awake    Reviewed: Allergy & Precautions, H&P , Patient's Chart, lab work & pertinent test results  Airway Mallampati: III TM Distance: >3 FB Neck ROM: Full    Dental no notable dental hx. (+) Teeth Intact   Pulmonary former smoker,  breath sounds clear to auscultation  Pulmonary exam normal       Cardiovascular negative cardio ROS  Rhythm:Regular Rate:Normal     Neuro/Psych negative neurological ROS  negative psych ROS   GI/Hepatic negative GI ROS, Neg liver ROS,   Endo/Other  Morbid obesity  Renal/GU negative Renal ROS  negative genitourinary   Musculoskeletal negative musculoskeletal ROS (+)   Abdominal (+) + obese,   Peds  Hematology  (+) anemia ,   Anesthesia Other Findings   Reproductive/Obstetrics (+) Pregnancy Previous C/Section                          Anesthesia Physical Anesthesia Plan  ASA: III  Anesthesia Plan: Epidural   Post-op Pain Management:    Induction:   Airway Management Planned: Natural Airway  Additional Equipment:   Intra-op Plan:   Post-operative Plan:   Informed Consent: I have reviewed the patients History and Physical, chart, labs and discussed the procedure including the risks, benefits and alternatives for the proposed anesthesia with the patient or authorized representative who has indicated his/her understanding and acceptance.     Plan Discussed with: Anesthesiologist  Anesthesia Plan Comments:         Anesthesia Quick Evaluation

## 2013-07-18 ENCOUNTER — Encounter (HOSPITAL_COMMUNITY): Payer: Self-pay

## 2013-07-18 DIAGNOSIS — O99892 Other specified diseases and conditions complicating childbirth: Secondary | ICD-10-CM

## 2013-07-18 DIAGNOSIS — O34219 Maternal care for unspecified type scar from previous cesarean delivery: Secondary | ICD-10-CM

## 2013-07-18 DIAGNOSIS — IMO0002 Reserved for concepts with insufficient information to code with codable children: Secondary | ICD-10-CM

## 2013-07-18 DIAGNOSIS — O9989 Other specified diseases and conditions complicating pregnancy, childbirth and the puerperium: Secondary | ICD-10-CM

## 2013-07-18 LAB — CBC
HEMATOCRIT: 28.1 % — AB (ref 36.0–46.0)
HEMOGLOBIN: 8.9 g/dL — AB (ref 12.0–15.0)
MCH: 23.2 pg — ABNORMAL LOW (ref 26.0–34.0)
MCHC: 31.7 g/dL (ref 30.0–36.0)
MCV: 73.4 fL — AB (ref 78.0–100.0)
Platelets: 218 10*3/uL (ref 150–400)
RBC: 3.83 MIL/uL — ABNORMAL LOW (ref 3.87–5.11)
RDW: 15.8 % — ABNORMAL HIGH (ref 11.5–15.5)
WBC: 21.2 10*3/uL — ABNORMAL HIGH (ref 4.0–10.5)

## 2013-07-18 LAB — COMPREHENSIVE METABOLIC PANEL
ALBUMIN: 2.6 g/dL — AB (ref 3.5–5.2)
ALT: 7 U/L (ref 0–35)
AST: 14 U/L (ref 0–37)
Alkaline Phosphatase: 199 U/L — ABNORMAL HIGH (ref 39–117)
BUN: 7 mg/dL (ref 6–23)
CO2: 21 mEq/L (ref 19–32)
CREATININE: 0.57 mg/dL (ref 0.50–1.10)
Calcium: 8.8 mg/dL (ref 8.4–10.5)
Chloride: 102 mEq/L (ref 96–112)
GFR calc non Af Amer: >90 mL/min (ref >90)
GLUCOSE: 94 mg/dL (ref 70–99)
Potassium: 4.4 mEq/L (ref 3.7–5.3)
Sodium: 136 mEq/L — ABNORMAL LOW (ref 137–147)
TOTAL PROTEIN: 6.9 g/dL (ref 6.0–8.3)
Total Bilirubin: <0.2 mg/dL — ABNORMAL LOW (ref 0.3–1.2)

## 2013-07-18 LAB — PROTEIN / CREATININE RATIO, URINE
CREATININE, URINE: 78.28 mg/dL
PROTEIN CREATININE RATIO: 0.31 — AB (ref 0.00–0.15)
Total Protein, Urine: 24 mg/dL

## 2013-07-18 LAB — RPR: RPR: NONREACTIVE

## 2013-07-18 MED ORDER — DIPHENHYDRAMINE HCL 25 MG PO CAPS: 25.0000 mg | ORAL_CAPSULE | Freq: Four times a day (QID) | ORAL | Status: DC | PRN

## 2013-07-18 MED ORDER — WITCH HAZEL-GLYCERIN EX PADS: 1.0000 "application " | MEDICATED_PAD | CUTANEOUS | Status: DC | PRN

## 2013-07-18 MED ORDER — ONDANSETRON HCL 4 MG PO TABS: 4.0000 mg | ORAL_TABLET | ORAL | Status: DC | PRN

## 2013-07-18 MED ORDER — LANOLIN HYDROUS EX OINT: TOPICAL_OINTMENT | CUTANEOUS | Status: DC | PRN

## 2013-07-18 MED ORDER — PRENATAL MULTIVITAMIN CH
1.0000 | ORAL_TABLET | Freq: Every day | ORAL | Status: DC
  Administered 2013-07-18 – 2013-07-19 (×2): 1 via ORAL
  Filled 2013-07-18 (×2): qty 1

## 2013-07-18 MED ORDER — ONDANSETRON HCL 4 MG/2ML IJ SOLN
4.0000 mg | INTRAMUSCULAR | Status: DC | PRN
Start: 2013-07-18 — End: 2013-07-19

## 2013-07-18 MED ORDER — DIBUCAINE 1 % RE OINT: 1.0000 "application " | TOPICAL_OINTMENT | RECTAL | Status: DC | PRN

## 2013-07-18 MED ORDER — BENZOCAINE-MENTHOL 20-0.5 % EX AERO
1.0000 "application " | INHALATION_SPRAY | CUTANEOUS | Status: DC | PRN
  Administered 2013-07-18: 1 via TOPICAL
  Filled 2013-07-18: qty 56

## 2013-07-18 MED ORDER — ZOLPIDEM TARTRATE 5 MG PO TABS: 5.0000 mg | ORAL_TABLET | Freq: Every evening | ORAL | Status: DC | PRN

## 2013-07-18 MED ORDER — MISOPROSTOL 200 MCG PO TABS
ORAL_TABLET | ORAL | Status: AC
  Filled 2013-07-18: qty 5

## 2013-07-18 MED ORDER — SIMETHICONE 80 MG PO CHEW: 80.0000 mg | CHEWABLE_TABLET | ORAL | Status: DC | PRN

## 2013-07-18 MED ORDER — SENNOSIDES-DOCUSATE SODIUM 8.6-50 MG PO TABS
2.0000 | ORAL_TABLET | ORAL | Status: DC
  Administered 2013-07-19: 2 via ORAL
  Filled 2013-07-18: qty 2

## 2013-07-18 MED ORDER — IBUPROFEN 600 MG PO TABS
600.0000 mg | ORAL_TABLET | Freq: Four times a day (QID) | ORAL | Status: DC
  Administered 2013-07-18 – 2013-07-19 (×6): 600 mg via ORAL
  Filled 2013-07-18 (×6): qty 1

## 2013-07-18 MED ORDER — OXYCODONE-ACETAMINOPHEN 5-325 MG PO TABS: 1.0000 | ORAL_TABLET | ORAL | Status: DC | PRN

## 2013-07-18 MED ORDER — MISOPROSTOL 200 MCG PO TABS
1000.0000 ug | ORAL_TABLET | Freq: Once | ORAL | Status: AC
  Administered 2013-07-18: 1000 ug via RECTAL

## 2013-07-18 NOTE — Progress Notes (Signed)
UR chart review completed.  

## 2013-07-18 NOTE — Anesthesia Postprocedure Evaluation (Signed)
  Anesthesia Post-op Note  Anesthesia Post Note  Patient: Meredith [name]  Procedure(s) Performed: * No procedures listed *  Anesthesia type: Epidural  Patient location: Mother/Baby  Post pain: Pain level controlled  Post assessment: Post-op Vital signs reviewed  Last Vitals:  Filed Vitals:   07/18/13 1045  BP: 117/76  Pulse: 88  Temp: 36.9 C  Resp: 20    Post vital signs: Reviewed  Level of consciousness:alert  Complications: No apparent anesthesia complications

## 2013-07-18 NOTE — Progress Notes (Signed)
Mom encouraged to feed her baby with cues and to offer the breast to baby every 3 hours

## 2013-07-18 NOTE — Progress Notes (Signed)
Meredith [name] is a 23 y.o. G3P1011 at 3876w6d by ultrasound admitted for induction of labor due to Post dates. Prior c/s d/t failure to descend.  Subjective:  Patient resting comfortably. No complaints  Objective: BP 134/78  Pulse 73  Temp(Src) 98.5 F (36.9 C) (Oral)  Resp 16  Ht 5\' 3"  (1.6 m)  Wt 107.049 kg (236 lb)  BMI 41.82 kg/m2  SpO2 99%      FHT:  FHR: 135 bpm, variability: moderate,  accelerations:  Present,  decelerations:  Absent UC:   regular, every 2-2.5 minutes SVE:   Dilation: 6 Effacement (%): 90 Exam by:: Dr Reola CalkinsBeck   Labs: Lab Results  Component Value Date   WBC 9.8 07/17/2013   HGB 9.7* 07/17/2013   HCT 30.9* 07/17/2013   MCV 73.6* 07/17/2013   PLT 228 07/17/2013    Assessment / Plan: Induction of labor due to postdates with h/o prior C-section,  progressing well on pitocin.     Labor: Progressing normally with Pitocin.   Preeclampsia:  Elevated BPs: max systolic 141. Max diastolic 98. Prot/Cr 0.31. Most recent BP 134/78. Continue to monitor  Fetal Wellbeing:  Category I Pain Control:  Epidural I/D:  GBS +, on PCN Anticipated MOD:  NSVD  Quincy SimmondsFeeney, Patricia L 07/18/2013, 1:23 AM  I have seen and examined this patient and agree with above documentation in the resident's note. No bag felt on exam and fetal hair felt.  Likely SROM at some point.  Also, several elevated BPs to the 140s/90s.  Prot/cr 0.31.  Diagnosis of Pre-Eclampsia given.  Will hold on mag now as no severe range pressures.  Platelets and LFTs WNL. Cont to monitor.  FWB- cat I tracing  Rulon AbideKeli Ellinore Merced, M.D. [facility] 07/18/2013 2:20 AM

## 2013-07-19 ENCOUNTER — Inpatient Hospital Stay (HOSPITAL_COMMUNITY): Admission: RE | Admit: 2013-07-19 | Payer: Medicaid Other | Source: Ambulatory Visit

## 2013-07-19 DIAGNOSIS — O34219 Maternal care for unspecified type scar from previous cesarean delivery: Secondary | ICD-10-CM | POA: Diagnosis not present

## 2013-07-19 MED ORDER — FERROUS SULFATE 325 (65 FE) MG PO TABS: 325.0000 mg | ORAL_TABLET | Freq: Two times a day (BID) | ORAL | Status: AC

## 2013-07-19 MED ORDER — MEASLES, MUMPS & RUBELLA VAC ~~LOC~~ INJ
0.5000 mL | INJECTION | Freq: Once | SUBCUTANEOUS | Status: AC
  Administered 2013-07-19: 0.5 mL via SUBCUTANEOUS
  Filled 2013-07-19 (×2): qty 0.5

## 2013-07-19 MED ORDER — IBUPROFEN 600 MG PO TABS: 600.0000 mg | ORAL_TABLET | Freq: Four times a day (QID) | ORAL | Status: DC | PRN

## 2013-07-19 NOTE — Discharge Instructions (Signed)
Breastfeeding Deciding to breastfeed is one of the best choices you can make for you and your baby. A change in hormones during pregnancy causes your breast tissue to grow and increases the number and size of your milk ducts. These hormones also allow proteins, sugars, and fats from your blood supply to make breast milk in your milk-producing glands. Hormones prevent breast milk from being released before your baby is born as well as prompt milk flow after birth. Once breastfeeding has begun, thoughts of your baby, as well as his or her sucking or crying, can stimulate the release of milk from your milk-producing glands.  BENEFITS OF BREASTFEEDING For Your Baby  Your first milk (colostrum) helps your baby's digestive system function better.   There are antibodies in your milk that help your baby fight off infections.   Your baby has a lower incidence of asthma, allergies, and sudden infant death syndrome.   The nutrients in breast milk are better for your baby than infant formulas and are designed uniquely for your baby's needs.   Breast milk improves your baby's brain development.   Your baby is less likely to develop other conditions, such as childhood obesity, asthma, or type 2 diabetes mellitus.  For You   Breastfeeding helps to create a very special bond between you and your baby.   Breastfeeding is convenient. Breast milk is always available at the correct temperature and costs nothing.   Breastfeeding helps to burn calories and helps you lose the weight gained during pregnancy.   Breastfeeding makes your uterus contract to its prepregnancy size faster and slows bleeding (lochia) after you give birth.   Breastfeeding helps to lower your risk of developing type 2 diabetes mellitus, osteoporosis, and breast or ovarian cancer later in life. SIGNS THAT YOUR BABY IS HUNGRY Early Signs of Hunger  Increased alertness or activity.  Stretching.  Movement of the head from  side to side.  Movement of the head and opening of the mouth when the corner of the mouth or cheek is stroked (rooting).  Increased sucking sounds, smacking lips, cooing, sighing, or squeaking.  Hand-to-mouth movements.  Increased sucking of fingers or hands. Late Signs of Hunger  Fussing.  Intermittent crying. Extreme Signs of Hunger Signs of extreme hunger will require calming and consoling before your baby will be able to breastfeed successfully. Do not wait for the following signs of extreme hunger to occur before you initiate breastfeeding:   Restlessness.  A loud, strong cry.   Screaming. BREASTFEEDING BASICS Breastfeeding Initiation  Find a comfortable place to sit or lie down, with your neck and back well supported.  Place a pillow or rolled up blanket under your baby to bring him or her to the level of your breast (if you are seated). Nursing pillows are specially designed to help support your arms and your baby while you breastfeed.  Make sure that your baby's abdomen is facing your abdomen.   Gently massage your breast. With your fingertips, massage from your chest wall toward your nipple in a circular motion. This encourages milk flow. You may need to continue this action during the feeding if your milk flows slowly.  Support your breast with 4 fingers underneath and your thumb above your nipple. Make sure your fingers are well away from your nipple and your baby's mouth.   Stroke your baby's lips gently with your finger or nipple.   When your baby's mouth is open wide enough, quickly bring your baby to your  breast, placing your entire nipple and as much of the colored area around your nipple (areola) as possible into your baby's mouth.   °· More areola should be visible above your baby's upper lip than below the lower lip.   °· Your baby's tongue should be between his or her lower gum and your breast.   °· Ensure that your baby's mouth is correctly positioned  around your nipple (latched). Your baby's lips should create a seal on your breast and be turned out (everted). °· It is common for your baby to suck about 2 3 minutes in order to start the flow of breast milk. °Latching °Teaching your baby how to latch on to your breast properly is very important. An improper latch can cause nipple pain and decreased milk supply for you and poor weight gain in your baby. Also, if your baby is not latched onto your nipple properly, he or she may swallow some air during feeding. This can make your baby fussy. Burping your baby when you switch breasts during the feeding can help to get rid of the air. However, teaching your baby to latch on properly is still the best way to prevent fussiness from swallowing air while breastfeeding. °Signs that your baby has successfully latched on to your nipple:    °· Silent tugging or silent sucking, without causing you pain.   °· Swallowing heard between every 3 4 sucks.   °·  Muscle movement above and in front of his or her ears while sucking.   °Signs that your baby has not successfully latched on to nipple:  °· Sucking sounds or smacking sounds from your baby while breastfeeding. °· Nipple pain. °If you think your baby has not latched on correctly, slip your finger into the corner of your baby's mouth to break the suction and place it between your baby's gums. Attempt breastfeeding initiation again. °Signs of Successful Breastfeeding °Signs from your baby:   °· A gradual decrease in the number of sucks or complete cessation of sucking.   °· Falling asleep.   °· Relaxation of his or her body.   °· Retention of a small amount of milk in his or her mouth.   °· Letting go of your breast by himself or herself. °Signs from you: °· Breasts that have increased in firmness, weight, and size 1 3 hours after feeding.   °· Breasts that are softer immediately after breastfeeding. °· Increased milk volume, as well as a change in milk consistency and color by  the 5th day of breastfeeding.   °· Nipples that are not sore, cracked, or bleeding. °Signs That Your Baby is Getting Enough Milk °· Wetting at least 3 diapers in a 24-hour period. The urine should be clear and pale yellow by age 5 days. °· At least 3 stools in a 24-hour period by age 5 days. The stool should be soft and yellow. °· At least 3 stools in a 24-hour period by age 7 days. The stool should be seedy and yellow. °· No loss of weight greater than 10% of birth weight during the first 3 days of age. °· Average weight gain of 4 7 ounces (120 210 mL) per week after age 4 days. °· Consistent daily weight gain by age 5 days, without weight loss after the age of 2 weeks. °After a feeding, your baby may spit up a small amount. This is common. °BREASTFEEDING FREQUENCY AND DURATION °Frequent feeding will help you make more milk and can prevent sore nipples and breast engorgement. Breastfeed when you feel the need to reduce   the fullness of your breasts or when your baby shows signs of hunger. This is called "breastfeeding on demand." Avoid introducing a pacifier to your baby while you are working to establish breastfeeding (the first 4 6 weeks after your baby is born). After this time you may choose to use a pacifier. Research has shown that pacifier use during the first year of a baby's life decreases the risk of sudden infant death syndrome (SIDS). Allow your baby to feed on each breast as long as he or she wants. Breastfeed until your baby is finished feeding. When your baby unlatches or falls asleep while feeding from the first breast, offer the second breast. Because newborns are often sleepy in the first few weeks of life, you may need to awaken your baby to get him or her to feed. Breastfeeding times will vary from baby to baby. However, the following rules can serve as a guide to help you ensure that your baby is properly fed:  Newborns (babies 67 weeks of age or younger) may breastfeed every 1 3  hours.  Newborns should not go longer than 3 hours during the day or 5 hours during the night without breastfeeding.  You should breastfeed your baby a minimum of 8 times in a 24-hour period until you begin to introduce solid foods to your baby at around 26 months of age. BREAST MILK PUMPING Pumping and storing breast milk allows you to ensure that your baby is exclusively fed your breast milk, even at times when you are unable to breastfeed. This is especially important if you are going back to work while you are still breastfeeding or when you are not able to be present during feedings. Your lactation consultant can give you guidelines on how long it is safe to store breast milk.  A breast pump is a machine that allows you to pump milk from your breast into a sterile bottle. The pumped breast milk can then be stored in a refrigerator or freezer. Some breast pumps are operated by hand, while others use electricity. Ask your lactation consultant which type will work best for you. Breast pumps can be purchased, but some hospitals and breastfeeding support groups lease breast pumps on a monthly basis. A lactation consultant can teach you how to hand express breast milk, if you prefer not to use a pump.  CARING FOR YOUR BREASTS WHILE YOU BREASTFEED Nipples can become dry, cracked, and sore while breastfeeding. The following recommendations can help keep your breasts moisturized and healthy:  Avoid using soap on your nipples.   Wear a supportive bra. Although not required, special nursing bras and tank tops are designed to allow access to your breasts for breastfeeding without taking off your entire bra or top. Avoid wearing underwire style bras or extremely tight bras.  Air dry your nipples for 3 21minutes after each feeding.   Use only cotton bra pads to absorb leaked breast milk. Leaking of breast milk between feedings is normal.   Use lanolin on your nipples after breastfeeding. Lanolin helps to  maintain your skin's normal moisture barrier. If you use pure lanolin you do not need to wash it off before feeding your baby again. Pure lanolin is not toxic to your baby. You may also hand express a few drops of breast milk and gently massage that milk into your nipples and allow the milk to air dry. In the first few weeks after giving birth, some women experience extremely full breasts (engorgement). Engorgement can make  your breasts feel heavy, warm, and tender to the touch. Engorgement peaks within 3 5 days after you give birth. The following recommendations can help ease engorgement: °· Completely empty your breasts while breastfeeding or pumping. You may want to start by applying warm, moist heat (in the shower or with warm water-soaked hand towels) just before feeding or pumping. This increases circulation and helps the milk flow. If your baby does not completely empty your breasts while breastfeeding, pump any extra milk after he or she is finished. °· Wear a snug bra (nursing or regular) or tank top for 1 2 days to signal your body to slightly decrease milk production. °· Apply ice packs to your breasts, unless this is too uncomfortable for you. °· Make sure that your baby is latched on and positioned properly while breastfeeding. °If engorgement persists after 48 hours of following these recommendations, contact your health care provider or a lactation consultant. °OVERALL HEALTH CARE RECOMMENDATIONS WHILE BREASTFEEDING °· Eat healthy foods. Alternate between meals and snacks, eating 3 of each per day. Because what you eat affects your breast milk, some of the foods may make your baby more irritable than usual. Avoid eating these foods if you are sure that they are negatively affecting your baby. °· Drink milk, fruit juice, and water to satisfy your thirst (about 10 glasses a day).   °· Rest often, relax, and continue to take your prenatal vitamins to prevent fatigue, stress, and anemia. °· Continue  breast self-awareness checks. °· Avoid chewing and smoking tobacco. °· Avoid alcohol and drug use. °Some medicines that may be harmful to your baby can pass through breast milk. It is important to ask your health care provider before taking any medicine, including all over-the-counter and prescription medicine as well as vitamin and herbal supplements. °It is possible to become pregnant while breastfeeding. If birth control is desired, ask your health care provider about options that will be safe for your baby. °SEEK MEDICAL CARE IF:  °· You feel like you want to stop breastfeeding or have become frustrated with breastfeeding. °· You have painful breasts or nipples. °· Your nipples are cracked or bleeding. °· Your breasts are red, tender, or warm. °· You have a swollen area on either breast. °· You have a fever or chills. °· You have nausea or vomiting. °· You have drainage other than breast milk from your nipples. °· Your breasts do not become full before feedings by the 5th day after you give birth. °· You feel sad and depressed. °· Your baby is too sleepy to eat well. °· Your baby is having trouble sleeping.   °· Your baby is wetting less than 3 diapers in a 24-hour period. °· Your baby has less than 3 stools in a 24-hour period. °· Your baby's skin or the white part of his or her eyes becomes yellow.   °· Your baby is not gaining weight by 5 days of age. °SEEK IMMEDIATE MEDICAL CARE IF:  °· Your baby is overly tired (lethargic) and does not want to wake up and feed. °· Your baby develops an unexplained fever. °Document Released: 06/01/2005 Document Revised: 02/01/2013 Document Reviewed: 11/23/2012 °[employer]® Patient Information ©2014 [employer]. ° °Vaginal Delivery °Care After °Refer to this sheet in the next few weeks. These discharge instructions provide you with information on caring for yourself after delivery. Your caregiver may also give you specific instructions. Your treatment has been planned  according to the most current medical practices available, but problems sometimes occur.   Call your caregiver if you have any problems or questions after you go home. HOME CARE INSTRUCTIONS  Take over-the-counter or prescription medicines only as directed by your caregiver or pharmacist.  Do not drink alcohol, especially if you are breastfeeding or taking medicine to relieve pain.  Do not chew or smoke tobacco.  Do not use illegal drugs.  Continue to use good perineal care. Good perineal care includes:  Wiping your perineum from front to back.  Keeping your perineum clean.  Do not use tampons or douche until your caregiver says it is okay.  Shower, wash your hair, and take tub baths as directed by your caregiver.  Wear a well-fitting bra that provides breast support.  Eat healthy foods.  Drink enough fluids to keep your urine clear or pale yellow.  Eat high-fiber foods such as whole grain cereals and breads, brown rice, beans, and fresh fruits and vegetables every day. These foods may help prevent or relieve constipation.  Follow your cargiver's recommendations regarding resumption of activities such as climbing stairs, driving, lifting, exercising, or traveling.  Talk to your caregiver about resuming sexual activities. Resumption of sexual activities is dependent upon your risk of infection, your rate of healing, and your comfort and desire to resume sexual activity.  Try to have someone help you with your household activities and your newborn for at least a few days after you leave the hospital.  Rest as much as possible. Try to rest or take a nap when your newborn is sleeping.  Increase your activities gradually.  Keep all of your scheduled postpartum appointments. It is very important to keep your scheduled follow-up appointments. At these appointments, your caregiver will be checking to make sure that you are healing physically and emotionally. SEEK MEDICAL CARE IF:    You are passing large clots from your vagina. Save any clots to show your caregiver.  You have a foul smelling discharge from your vagina.  You have trouble urinating.  You are urinating frequently.  You have pain when you urinate.  You have a change in your bowel movements.  You have increasing redness, pain, or swelling near your vaginal incision (episiotomy) or vaginal tear.  You have pus draining from your episiotomy or vaginal tear.  Your episiotomy or vaginal tear is separating.  You have painful, hard, or reddened breasts.  You have a severe headache.  You have blurred vision or see spots.  You feel sad or depressed.  You have thoughts of hurting yourself or your newborn.  You have questions about your care, the care of your newborn, or medicines.  You are dizzy or lightheaded.  You have a rash.  You have nausea or vomiting.  You were breastfeeding and have not had a menstrual period within 12 weeks after you stopped breastfeeding.  You are not breastfeeding and have not had a menstrual period by the 12th week after delivery.  You have a fever. SEEK IMMEDIATE MEDICAL CARE IF:   You have persistent pain.  You have chest pain.  You have shortness of breath.  You faint.  You have leg pain.  You have stomach pain.  Your vaginal bleeding saturates two or more sanitary pads in 1 hour. MAKE SURE YOU:   Understand these instructions.  Will watch your condition.  Will get help right away if you are not doing well or get worse. Document Released: 05/29/2000 Document Revised: 02/24/2012 Document Reviewed: 01/27/2012 [facility] Patient Information 2014 [place], [place].  Hypertension During Pregnancy Hypertension  is also called high blood pressure. It can occur at any time in life and during pregnancy. When you have hypertension, there is extra pressure inside your blood vessels that carry blood from the heart to the rest of your body (arteries).  Hypertension during pregnancy can cause problems for you and your baby. Your baby might not weigh as much as it should at birth or might be born early (premature). Very bad cases of hypertension during pregnancy can be life threatening.  Different types of hypertension can occur during pregnancy.   Chronic hypertension. This happens when a woman has hypertension before pregnancy and it continues during pregnancy.  Gestational hypertension. This is when hypertension develops during pregnancy.  Preeclampsia or toxemia of pregnancy. This is a very serious type of hypertension that develops only during pregnancy. It is a disease that affects the whole body (systemic) and can be very dangerous for both mother and baby.  Gestational hypertension and preeclampsia usually go away after your baby is born. Blood pressure generally stabilizes within 6 weeks. Women who have hypertension during pregnancy have a greater chance of developing hypertension later in life or with future pregnancies. RISK FACTORS Some factors make you more likely to develop hypertension during pregnancy. Risk factors include:  Having hypertension before pregnancy.  Having hypertension during a previous pregnancy.  Being overweight.  Being older than 40.  Being pregnant with more than one baby (multiples).  Having diabetes or kidney problems. SIGNS AND SYMPTOMS Chronic and gestational hypertension rarely cause symptoms. Preeclampsia has symptoms, which may include:  Increased protein in your urine. Your health care provider will check for this at every prenatal visit.  Swelling of your hands and face.  Rapid weight gain.  Headaches.  Visual changes.  Being bothered by light.  Abdominal pain, especially in the right upper area.  Chest pain.  Shortness of breath.  Increased reflexes.  Seizures. Seizures occur with a more severe form of preeclampsia, called eclampsia. DIAGNOSIS   You may be diagnosed with  hypertension during a regular prenatal exam. At each visit, tests may include:  Blood pressure checks.  A urine test to check for protein in your urine.  The type of hypertension you are diagnosed with depends on when you developed it. It also depends on your specific blood pressure reading.  Developing hypertension before 20 weeks of pregnancy is consistent with chronic hypertension.  Developing hypertension after 20 weeks of pregnancy is consistent with gestational hypertension.  Hypertension with increased urinary protein is diagnosed as preeclampsia.  Blood pressure measurements that stay above 160 systolic or 110 diastolic are a sign of severe preeclampsia. TREATMENT Treatment for hypertension during pregnancy varies. Treatment depends on the type of hypertension and how serious it is.  If you take medicine for chronic hypertension, you may need to switch medicines.  Drugs called ACE inhibitors should not be taken during pregnancy.  Low-dose aspirin may be suggested for women who have risk factors for preeclampsia.  If you have gestational hypertension, you may need to take a blood pressure medicine that is safe during pregnancy. Your health care provider will recommend the appropriate medicine.  If you have severe preeclampsia, you may need to be in the hospital. Health care providers will watch you and the baby very closely. You also may need to take medicine (magnesium sulfate) to prevent seizures and lower blood pressure.  Sometimes an early delivery is needed. This may be the case if the condition worsens. It would be done to  protect you and the baby. The only cure for preeclampsia is delivery. HOME CARE INSTRUCTIONS  Schedule and keep all of your regular appointments for prenatal care.  Only take over-the-counter or prescription medicines as directed by your health care provider. Tell your health care provider about all medicines you take.  Eat as little salt as  possible.  Get regular exercise.  Do not drink alcohol.  Do not use tobacco products.  Do not drink products with caffeine.  Lie on your left side when resting. SEEK IMMEDIATE MEDICAL CARE IF:  You have severe abdominal pain.  You have sudden swelling in the hands, ankles, or face.  You gain 4 pounds (1.8 kg) or more in 1 week.  You vomit repeatedly.  You have vaginal bleeding.  You do not feel the baby moving as much.  You have a headache.  You have blurred or double vision.  You have muscle twitching or spasms.  You have shortness of breath.  You have blue fingernails and lips.  You have blood in your urine. MAKE SURE YOU:  Understand these instructions.  Will watch your condition.  Will get help right away if you are not doing well or get worse. Document Released: 02/17/2011 Document Revised: 03/22/2013 Document Reviewed: 12/29/2012 [facility] Patient Information 2014 [place], [place].

## 2013-07-19 NOTE — Discharge Summary (Signed)
  Obstetric Discharge Summary Reason for Admission: onset of labor Prenatal Procedures: NST Intrapartum Procedures: spontaneous vaginal delivery and GBS prophylaxis Postpartum Procedures: none Complications-Operative and Postpartum: 2nd degree perineal laceration Hemoglobin  Date Value Range Status  07/18/2013 8.9* 12.0 - 15.0 g/dL Final     HCT  Date Value Range Status  07/18/2013 28.1* 36.0 - 46.0 % Final    Physical Exam:  General: alert, cooperative and no distress Lochia: appropriate Uterine Fundus: firm DVT Evaluation: No evidence of DVT seen on physical exam. Negative Homan's sign.  Discharge Diagnoses: Term Pregnancy-delivered and Preelampsia, anemia  Discharge Information: Date: 07/19/2013 Activity: pelvic rest Diet: routine Medications: Ibuprofen and Colace Condition: stable Instructions: refer to practice specific booklet Discharge to: home   Newborn Data: Live born female  Birth Weight: 8 lb 11 oz (3940 g) APGAR: 6, 9  Home with mother.  Meredith [name], Meredith [name] 07/19/2013, 8:54 AM  I was present for the exam and agree with above. FeSo4.   [place]elp[place]ia Shankar Silber, CNM 07/19/2013 9:11 AM

## 2013-07-20 NOTE — Discharge Summary (Signed)

## 2013-08-23 ENCOUNTER — Ambulatory Visit: Payer: Self-pay | Admitting: Obstetrics and Gynecology

## 2013-08-23 ENCOUNTER — Telehealth: Payer: Self-pay

## 2013-08-23 NOTE — Telephone Encounter (Signed)
Pt. Missed today's post-partum appointment with Dr. Jolayne Pantheronstant. Attempted to call pt. No answer. Left message stating we noted you missed your appointment this afternoon with Dr. Jolayne Pantheronstant, please call our clinic to re-schedule your post-partum appointment.

## 2013-09-11 ENCOUNTER — Encounter: Payer: Self-pay | Admitting: Obstetrics & Gynecology

## 2013-09-11 ENCOUNTER — Ambulatory Visit (INDEPENDENT_AMBULATORY_CARE_PROVIDER_SITE_OTHER): Payer: Medicaid Other | Admitting: Obstetrics & Gynecology

## 2013-09-11 VITALS — BP 122/78 | HR 103 | Temp 97.3°F | Ht 60.0 in | Wt 223.0 lb

## 2013-09-11 DIAGNOSIS — Z3009 Encounter for other general counseling and advice on contraception: Secondary | ICD-10-CM

## 2013-09-11 MED ORDER — NORGESTIMATE-ETH ESTRADIOL 0.25-35 MG-MCG PO TABS: 1.0000 | ORAL_TABLET | Freq: Every day | ORAL | Status: DC

## 2013-09-11 NOTE — Progress Notes (Signed)
Patient ID: Meredith [name], female   DOB: 1990-12-10, 23 y.o.   MRN: [id number] Subjective:     Meredith [name] is a 23 y.o. female who presents for a postpartum visit. She is 7 weeks postpartum following a spontaneous vaginal delivery. I have fully reviewed the prenatal and intrapartum course. The delivery was at 41 gestational weeks. Outcome: spontaneous vaginal delivery. Anesthesia: epidural. Postpartum course has been uncomplicated.  Baby's course has been unremarkable. Baby is feeding by breast. Bleeding no bleeding. Bowel function is normal. Bladder function is normal. Patient is not sexually active. Contraception method is abstinence. Postpartum depression screening: negative.  The following portions of the patient's history were reviewed and updated as appropriate: allergies, current medications, past family history, past medical history, past social history, past surgical history and problem list.  Review of Systems A comprehensive review of systems was negative.   Objective:    BP 122/78  Pulse 103  Temp(Src) 97.3 F (36.3 C) (Oral)  Ht 5' (1.524 m)  Wt 223 lb (101.152 kg)  BMI 43.55 kg/m2  LMP 08/30/2013  Breastfeeding? Yes  General:  alert and no distress           Abdomen: soft, non-tender; bowel sounds normal; no masses,  no organomegaly   Vulva:  normal  Vagina: normal vagina, no discharge, exudate, lesion, or erythema  Cervix:  no cervical motion tenderness  Corpus: normal  Adnexa:  no mass, fullness, tenderness  Rectal Exam: Not performed.        Assessment:    7 weeks postpartum exam. Pap smear not done at today's visit.  Contraception counseling-Pt wants Mirena but is waiting for her ins  Plan:    1. Contraception: OCP (estrogen/progesterone) 2. Sprintec 1 po q day 3. Follow up in for Mirena as soon asmirean

## 2013-09-11 NOTE — Patient Instructions (Signed)
Levonorgestrel intrauterine device (IUD) What is this medicine? LEVONORGESTREL IUD (LEE voe nor jes trel) is a contraceptive (birth control) device. The device is placed inside the uterus by a healthcare professional. It is used to prevent pregnancy and can also be used to treat heavy bleeding that occurs during your period. Depending on the device, it can be used for 3 to 5 years. This medicine may be used for other purposes; ask your health care provider or pharmacist if you have questions. COMMON BRAND NAME(S): Mirena, Skyla What should I tell my health care provider before I take this medicine? They need to know if you have any of these conditions: -abnormal Pap smear -cancer of the breast, uterus, or cervix -diabetes -endometritis -genital or pelvic infection now or in the past -have more than one sexual partner or your partner has more than one partner -heart disease -history of an ectopic or tubal pregnancy -immune system problems -IUD in place -liver disease or tumor -problems with blood clots or take blood-thinners -use intravenous drugs -uterus of unusual shape -vaginal bleeding that has not been explained -an unusual or allergic reaction to levonorgestrel, other hormones, silicone, or polyethylene, medicines, foods, dyes, or preservatives -pregnant or trying to get pregnant -breast-feeding How should I use this medicine? This device is placed inside the uterus by a health care professional. Talk to your pediatrician regarding the use of this medicine in children. Special care may be needed. Overdosage: If you think you have taken too much of this medicine contact a poison control center or emergency room at once. NOTE: This medicine is only for you. Do not share this medicine with others. What if I miss a dose? This does not apply. What may interact with this medicine? Do not take this medicine with any of the following  medications: -amprenavir -bosentan -fosamprenavir This medicine may also interact with the following medications: -aprepitant -barbiturate medicines for inducing sleep or treating seizures -bexarotene -griseofulvin -medicines to treat seizures like carbamazepine, ethotoin, felbamate, oxcarbazepine, phenytoin, topiramate -modafinil -pioglitazone -rifabutin -rifampin -rifapentine -some medicines to treat HIV infection like atazanavir, indinavir, lopinavir, nelfinavir, tipranavir, ritonavir -St. John's wort -warfarin This list may not describe all possible interactions. Give your health care provider a list of all the medicines, herbs, non-prescription drugs, or dietary supplements you use. Also tell them if you smoke, drink alcohol, or use illegal drugs. Some items may interact with your medicine. What should I watch for while using this medicine? Visit your doctor or health care professional for regular check ups. See your doctor if you or your partner has sexual contact with others, becomes HIV positive, or gets a sexual transmitted disease. This product does not protect you against HIV infection (AIDS) or other sexually transmitted diseases. You can check the placement of the IUD yourself by reaching up to the top of your vagina with clean fingers to feel the threads. Do not pull on the threads. It is a good habit to check placement after each menstrual period. Call your doctor right away if you feel more of the IUD than just the threads or if you cannot feel the threads at all. The IUD may come out by itself. You may become pregnant if the device comes out. If you notice that the IUD has come out use a backup birth control method like condoms and call your health care provider. Using tampons will not change the position of the IUD and are okay to use during your period. What side effects may I   notice from receiving this medicine? Side effects that you should report to your doctor or  health care professional as soon as possible: -allergic reactions like skin rash, itching or hives, swelling of the face, lips, or tongue -fever, flu-like symptoms -genital sores -high blood pressure -no menstrual period for 6 weeks during use -pain, swelling, warmth in the leg -pelvic pain or tenderness -severe or sudden headache -signs of pregnancy -stomach cramping -sudden shortness of breath -trouble with balance, talking, or walking -unusual vaginal bleeding, discharge -yellowing of the eyes or skin Side effects that usually do not require medical attention (report to your doctor or health care professional if they continue or are bothersome): -acne -breast pain -change in sex drive or performance -changes in weight -cramping, dizziness, or faintness while the device is being inserted -headache -irregular menstrual bleeding within first 3 to 6 months of use -nausea This list may not describe all possible side effects. Call your doctor for medical advice about side effects. You may report side effects to FDA at 1-800-FDA-1088. Where should I keep my medicine? This does not apply. NOTE: This sheet is a summary. It may not cover all possible information. If you have questions about this medicine, talk to your doctor, pharmacist, or health care provider.  2014, Elsevier/Gold Standard. (2011-07-02 13:54:04)  

## 2014-04-16 ENCOUNTER — Encounter: Payer: Self-pay | Admitting: Obstetrics & Gynecology

## 2015-04-30 ENCOUNTER — Ambulatory Visit (INDEPENDENT_AMBULATORY_CARE_PROVIDER_SITE_OTHER): Payer: Self-pay | Admitting: Family Medicine

## 2015-04-30 VITALS — BP 124/68 | HR 106 | Temp 98.3°F | Resp 16 | Ht 63.0 in | Wt 232.6 lb

## 2015-04-30 DIAGNOSIS — Z3201 Encounter for pregnancy test, result positive: Secondary | ICD-10-CM

## 2015-04-30 DIAGNOSIS — N912 Amenorrhea, unspecified: Secondary | ICD-10-CM

## 2015-04-30 DIAGNOSIS — Z3481 Encounter for supervision of other normal pregnancy, first trimester: Secondary | ICD-10-CM

## 2015-04-30 LAB — POCT URINE PREGNANCY: Preg Test, Ur: POSITIVE — AB

## 2015-04-30 NOTE — Patient Instructions (Signed)
You are pregnant Please establish care with your OB as soon as possible If you have bleeding that is more than a period please go to the emergency room right away Please return if you have any questions or concerns

## 2015-04-30 NOTE — Progress Notes (Signed)
   Subjective:    Patient ID: Meredith [name], female    DOB: April 29, 1991, 24 y.o.   MRN: [id number]   CC: Concern for pregnancy  HPI 24 y/o [id number], LMP 8/20 but is unsure of this date  Pregnancy - Was not trying to get pregnant but was not using birth control - She knew she was pregnant right away after she missed her menses but did not seek care. She states " does not know why did not come in for testing earlier". She usually has regular meses - In previous pregnancies sought pregnancy medicaid and [facility].She has in past been able to be seen within a week after calling even before her medicaid completely came through - Today denies vaginal irritation, discharge, dysuria, bleeding       Review of Systems   See HPI for ROS.    OB Hx  S/p C/s x1 for breech, had GHTN Miscarriage S/p VBAC x1  Gyn Hx - Denies Hx of STDs - Has yet to have a Pap smear  Past Medical History  Diagnosis Date  . No pertinent past medical history    Past Surgical History  Procedure Laterality Date  . Cesarean section  04/29/2011    Procedure: CESAREAN SECTION;  Surgeon: Roseanna RainbowLisa A Jackson-Moore, MD;  Location: [facility];  Service: Gynecology;  Laterality: N/A;  Primary Cesarian Section   OB History    Gravida Para Term Preterm AB TAB SAB Ectopic Multiple Living   4 2 2  1  1   2      Social History   Social History  . Marital Status: Single    Spouse Name: N/A  . Number of Children: N/A  . Years of Education: N/A   Occupational History  . Not on file.   Social History Main Topics  . Smoking status: Former Smoker -- 0.25 packs/day    Types: Cigarettes    Quit date: 03/05/2015  . Smokeless tobacco: Never Used  . Alcohol Use: No  . Drug Use: No  . Sexual Activity: Not Currently    Birth Control/ Protection: None   Other Topics Concern  . Not on file   Social History Narrative    Objective:  BP 124/68 mmHg  Pulse 106  Temp(Src) 98.3 F (36.8 C) (Oral)   Resp 16  Ht 5\' 3"  (1.6 m)  Wt 232 lb 9.6 oz (105.507 kg)  BMI 41.21 kg/m2  SpO2 98%  LMP 01/11/2015 (Approximate) Vitals and nursing note reviewed  General: NAD Cardiac: RRR Respiratory: CTAB, normal effort Abdomen: obese, soft, nontender, nondistended Skin: warm and dry, no rashes noted Neuro: alert and oriented, no focal deficits   Assessment & Plan:    Pregnancy - + urine pregnancy test today - LMP 02/02/2015, unsure of exact date using this estimation she is approximately [redacted] weeks pregnant - Will need US to establish true dating - Given remote nature of LMP, no history of STDs, pregnancy is less likely to be ectopic in nature - Pt strongly counseled to establish OB care as soon as possible  - Given bleeding precautions - Counseled to continue a PNV  Marguriete Wootan A. Kennon RoundsHaney MD, MS Family Medicine Resident PGY-1 Pager [phone]

## 2015-05-20 NOTE — Progress Notes (Signed)
Patient ID: Meredith [name], female   DOB: [birth year], 24 y.o.   MRN: [id number] Reviewed documentation and agree w/ assessment and plan. Meredith SorensonEva Persia Lintner, MD MPH

## 2015-05-29 ENCOUNTER — Encounter: Payer: Medicaid Other | Admitting: Advanced Practice Midwife

## 2015-06-16 NOTE — L&D Delivery Note (Signed)
Patient complete and pushing. SVD of viable female infant over intact perineum. Nuchal cord x1, easily reduced at perineum. Infant delivered to mom's abdomen. Delayed cord clamping x 1 minute. Cord clamped x 2, cut. Spontaneous cry heard.   Cord blood obtained. Placenta delivered spontaneously and intact. LUS cleared of clot. Fundus firm on exam, pitocin running.  Lacerations: periurethral abrasion, non bleeding Suture: EBL: 50 cc Anesthesia: epidural  Apgars: 7/9 Weight: pending, skin to skin  Instrument and sponge count x2 correct.   Deniece ReeJosue D Santos, MD 04/16/2016 12:21 AM   Patient is a [id number] at 7479w1d who was admitted for postdates IOL, uncomplicated prenatal course.  She progressed with augmentation via Pit/AROM.  I was gloved and present for delivery in its entirety.  Second stage of labor progressed to SVD.  Mild decels during second stage noted.  Complications: none  Lacerations: none  EBL: 50cc  SHAW, KIMBERLY, CNM 7:52 PM

## 2015-06-26 ENCOUNTER — Encounter: Payer: Medicaid Other | Admitting: Advanced Practice Midwife

## 2015-06-26 ENCOUNTER — Ambulatory Visit: Payer: Medicaid Other | Admitting: Advanced Practice Midwife

## 2016-02-11 ENCOUNTER — Ambulatory Visit (INDEPENDENT_AMBULATORY_CARE_PROVIDER_SITE_OTHER): Payer: Self-pay | Admitting: Family Medicine

## 2016-02-11 VITALS — BP 122/72 | HR 100 | Temp 98.0°F | Resp 17 | Ht 63.5 in | Wt 232.0 lb

## 2016-02-11 DIAGNOSIS — N912 Amenorrhea, unspecified: Secondary | ICD-10-CM

## 2016-02-11 DIAGNOSIS — O34219 Maternal care for unspecified type scar from previous cesarean delivery: Secondary | ICD-10-CM

## 2016-02-11 DIAGNOSIS — Z3490 Encounter for supervision of normal pregnancy, unspecified, unspecified trimester: Secondary | ICD-10-CM

## 2016-02-11 LAB — POCT URINE PREGNANCY: Preg Test, Ur: POSITIVE — AB

## 2016-02-11 NOTE — Progress Notes (Signed)
By signing my name below, I, Mesha Guinyard, attest that this documentation has been prepared under the direction and in the presence of [employer].  Electronically Signed: Arvilla Market, Medical Scribe. 03/13/16. 9:25 AM.  Subjective:    Patient ID: Meredith [name], female    DOB: May 30, 1991, 25 y.o.   MRN: [id number]    02/11/2016  Other (pregnancy physical )   HPI  HPI Comments: Meredith [name] is a 25 y.o. female who presents to the [facility] for her pregnancy test. Pt LMP was in the middle of March and it was nl. Pt has 2 children, and she was pregnant last year but she had a miscarriage at 4 month. Pt was in [place] on vacation with her mom for her first trimester and was seeing a doctor while she was there. She just came back. Pt is from [place]. Pt is taking prenatal vitamins. Pt is [name] in December, but she doesn't know the sex. She knows there is a heart beat and she can feel the baby move. Pt has never got sick while she was pregnant. Pt has an OB/GYN appt in a week with [facility]. Pt went to Dr. Trudie Buckler, and the [facility] in the past for her other children. Pt mentions she's feeling well and she denies having any PMHx. Pts mom is 28 y/o and healthy and her dad is 39 y/o and healthy and her 2 brothers are healthy. Pt just got married in July. Pt's kids are 78 y/o and 1 y/o, and reports this is her last kid. Pt lives with her husband and she doesn't work. Pt doesn't smoke or do drugs, but she did drink occasionally when she wasn't pregnant.  Review of Systems  Constitutional: Negative for chills, diaphoresis, fatigue and fever.  Eyes: Negative for visual disturbance.  Respiratory: Negative for cough and shortness of breath.   Cardiovascular: Negative for chest pain, palpitations and leg swelling.  Gastrointestinal: Negative for abdominal pain, constipation, diarrhea, nausea and vomiting.  Endocrine: Negative for cold intolerance, heat intolerance, polydipsia,  polyphagia and polyuria.  Genitourinary: Negative for vaginal bleeding and vaginal discharge.  Neurological: Negative for dizziness, tremors, seizures, syncope, facial asymmetry, speech difficulty, weakness, light-headedness, numbness and headaches.   Past Medical History:  Diagnosis Date  . No pertinent past medical history    Past Surgical History:  Procedure Laterality Date  . CESAREAN SECTION  04/29/2011   Procedure: CESAREAN SECTION;  Surgeon: Roseanna Rainbow, MD;  Location: [facility];  Service: Gynecology;  Laterality: N/A;  Primary Cesarian Section   No Known Allergies Current Outpatient Prescriptions  Medication Sig Dispense Refill  . ferrous sulfate (SLOW IRON) 160 (50 Fe) MG TBCR SR tablet Take 1 tablet (160 mg total) by mouth daily. 30 each 0   No current facility-administered medications for this visit.    Social History   Social History  . Marital status: Single    Spouse name: N/A  . Number of children: N/A  . Years of education: N/A   Occupational History  . Not on file.   Social History Main Topics  . Smoking status: Former Smoker    Packs/day: 0.25    Types: Cigarettes    Quit date: 03/05/2015  . Smokeless tobacco: Never Used  . Alcohol use No  . Drug use: No  . Sexual activity: Yes    Birth control/ protection: None   Other Topics Concern  . Not on file   Social History Narrative  Marital status: married since 12/2015; from [place]; family from [place]      Children: 2 children (4 yo, 3yo)      Lives: with husband, 2 children      Employment: homemaker      Tobacco: none      Alcohol: socially/weekends      Drugs: none      Exercise: walking   Family History  Problem Relation Age of Onset  . Cancer Maternal Grandmother   . Diabetes Neg Hx   . Hyperlipidemia Neg Hx   . Hypertension Neg Hx   . Kidney disease Neg Hx   . Stroke Neg Hx       Objective:    BP 122/72 (BP Location: Right Arm, Patient Position: Sitting, Cuff Size: Normal)    Pulse 100   Temp 98 F (36.7 C) (Oral)   Resp 17   Ht 5' 3.5" (1.613 m)   Wt 232 lb (105.2 kg)   LMP 08/28/2015   SpO2 100%   BMI 40.45 kg/m  Physical Exam  Constitutional: She is oriented to person, place, and time. She appears well-developed and well-nourished. No distress.  HENT:  Head: Normocephalic and atraumatic.  Right Ear: External ear normal.  Left Ear: External ear normal.  Nose: Nose normal.  Mouth/Throat: Oropharynx is clear and moist.  Eyes: Conjunctivae and EOM are normal. Pupils are equal, round, and reactive to light.  Neck: Normal range of motion. Neck supple. Carotid bruit is not present. No thyromegaly present.  Cardiovascular: Normal rate, regular rhythm, normal heart sounds and intact distal pulses.  Exam reveals no gallop and no friction rub.   No murmur heard. Pulmonary/Chest: Effort normal and breath sounds normal. No respiratory distress. She has no wheezes. She has no rales.  Abdominal: Soft. Bowel sounds are normal. She exhibits no distension and no mass. There is no tenderness. There is no rebound and no guarding.  Lymphadenopathy:    She has no cervical adenopathy.  Neurological: She is alert and oriented to person, place, and time. No cranial nerve deficit.  Skin: Skin is warm and dry. No rash noted. She is not diaphoretic. No erythema. No pallor.  Psychiatric: She has a normal mood and affect. Her behavior is normal.  Nursing note and vitals reviewed.  Results for orders placed or performed in visit on 02/11/16  POCT urine pregnancy  Result Value Ref Range   Preg Test, Ur Positive (A) Negative   Assessment & Plan:   1. Amenorrhea   2. History of cesarean delivery, currently pregnant   3. Pregnancy    -New pregnancy.  -anticipatory guidance provided in detail; recommend establishing with OB/GYN.  Continue PNV.  Drink plenty of water to avoid dehydration and continue taking you prenatal vitamins.  Orders Placed This Encounter  Procedures  .  Care order/instruction:    AVS and GO    Scheduling Instructions:     AVS and GO  . POCT urine pregnancy   No orders of the defined types were placed in this encounter.   No Follow-up on file.  I personally performed the services described in this documentation, which was scribed in my presence. The recorded information has been reviewed and considered.  Linnie Delgrande Paulita FujitaMartin Halayna Blane, M.D. [facility]  [place] [address] [place], [place]  [id number] [phone] phone [phone] fax

## 2016-02-11 NOTE — Patient Instructions (Addendum)
IF you received an x-ray today, you will receive an invoice from [facility]. Please contact [facility] at [phone] with questions or concerns regarding your invoice.   IF you received labwork today, you will receive an invoice from [employer]. Please contact [facility] at [phone] with questions or concerns regarding your invoice.   Our billing staff will not be able to assist you with questions regarding bills from these companies.  You will be contacted with the lab results as soon as they are available. The fastest way to get your results is to activate your My Chart account. Instructions are located on the last page of this paperwork. If you have not heard from [place] regarding the results in 2 weeks, please contact this office.     Second Trimester of Pregnancy The second trimester is from week 13 through week 28, months 4 through 6. The second trimester is often a time when you feel your best. Your body has also adjusted to being pregnant, and you begin to feel better physically. Usually, morning sickness has lessened or quit completely, you may have more energy, and you may have an increase in appetite. The second trimester is also a time when the fetus is growing rapidly. At the end of the sixth month, the fetus is about 9 inches long and weighs about 1 pounds. You will likely begin to feel the baby move (quickening) between 18 and 20 weeks of the pregnancy. BODY CHANGES Your body goes through many changes during pregnancy. The changes vary from woman to woman.   Your weight will continue to increase. You will notice your lower abdomen bulging out.  You may begin to get stretch marks on your hips, abdomen, and breasts.  You may develop headaches that can be relieved by medicines approved by your health care provider.  You may urinate more often because the fetus is pressing on your bladder.  You may develop or continue to have  heartburn as a result of your pregnancy.  You may develop constipation because certain hormones are causing the muscles that push waste through your intestines to slow down.  You may develop hemorrhoids or swollen, bulging veins (varicose veins).  You may have back pain because of the weight gain and pregnancy hormones relaxing your joints between the bones in your pelvis and as a result of a shift in weight and the muscles that support your balance.  Your breasts will continue to grow and be tender.  Your gums may bleed and may be sensitive to brushing and flossing.  Dark spots or blotches (chloasma, mask of pregnancy) may develop on your face. This will likely fade after the baby is born.  A dark line from your belly button to the pubic area (linea nigra) may appear. This will likely fade after the baby is born.  You may have changes in your hair. These can include thickening of your hair, rapid growth, and changes in texture. Some women also have hair loss during or after pregnancy, or hair that feels dry or thin. Your hair will most likely return to normal after your baby is born. WHAT TO EXPECT AT YOUR PRENATAL VISITS During a routine prenatal visit:  You will be weighed to make sure you and the fetus are growing normally.  Your blood pressure will be taken.  Your abdomen will be measured to track your baby's growth.  The fetal heartbeat will be listened to.  Any test results from the  previous visit will be discussed. Your health care provider may ask you:  How you are feeling.  If you are feeling the baby move.  If you have had any abnormal symptoms, such as leaking fluid, bleeding, severe headaches, or abdominal cramping.  If you are using any tobacco products, including cigarettes, chewing tobacco, and electronic cigarettes.  If you have any questions. Other tests that may be performed during your second trimester include:  Blood tests that check for:  Low iron  levels (anemia).  Gestational diabetes (between 24 and 28 weeks).  Rh antibodies.  Urine tests to check for infections, diabetes, or protein in the urine.  An ultrasound to confirm the proper growth and development of the baby.  An amniocentesis to check for possible genetic problems.  Fetal screens for spina bifida and Down syndrome.  HIV (human immunodeficiency virus) testing. Routine prenatal testing includes screening for HIV, unless you choose not to have this test. HOME CARE INSTRUCTIONS   Avoid all smoking, herbs, alcohol, and unprescribed drugs. These chemicals affect the formation and growth of the baby.  Do not use any tobacco products, including cigarettes, chewing tobacco, and electronic cigarettes. If you need help quitting, ask your health care provider. You may receive counseling support and other resources to help you quit.  Follow your health care provider's instructions regarding medicine use. There are medicines that are either safe or unsafe to take during pregnancy.  Exercise only as directed by your health care provider. Experiencing uterine cramps is a good sign to stop exercising.  Continue to eat regular, healthy meals.  Wear a good support bra for breast tenderness.  Do not use hot tubs, steam rooms, or saunas.  Wear your seat belt at all times when driving.  Avoid raw meat, uncooked cheese, cat litter boxes, and soil used by cats. These carry germs that can cause birth defects in the baby.  Take your prenatal vitamins.  Take 1500-2000 mg of calcium daily starting at the 20th week of pregnancy until you deliver your baby.  Try taking a stool softener (if your health care provider approves) if you develop constipation. Eat more high-fiber foods, such as fresh vegetables or fruit and whole grains. Drink plenty of fluids to keep your urine clear or pale yellow.  Take warm sitz baths to soothe any pain or discomfort caused by hemorrhoids. Use hemorrhoid  cream if your health care provider approves.  If you develop varicose veins, wear support hose. Elevate your feet for 15 minutes, 3-4 times a day. Limit salt in your diet.  Avoid heavy lifting, wear low heel shoes, and practice good posture.  Rest with your legs elevated if you have leg cramps or low back pain.  Visit your dentist if you have not gone yet during your pregnancy. Use a soft toothbrush to brush your teeth and be gentle when you floss.  A sexual relationship may be continued unless your health care provider directs you otherwise.  Continue to go to all your prenatal visits as directed by your health care provider. SEEK MEDICAL CARE IF:   You have dizziness.  You have mild pelvic cramps, pelvic pressure, or nagging pain in the abdominal area.  You have persistent nausea, vomiting, or diarrhea.  You have a bad smelling vaginal discharge.  You have pain with urination. SEEK IMMEDIATE MEDICAL CARE IF:   You have a fever.  You are leaking fluid from your vagina.  You have spotting or bleeding from your vagina.  You  have severe abdominal cramping or pain.  You have rapid weight gain or loss.  You have shortness of breath with chest pain.  You notice sudden or extreme swelling of your face, hands, ankles, feet, or legs.  You have not felt your baby move in over an hour.  You have severe headaches that do not go away with medicine.  You have vision changes.   This information is not intended to replace advice given to you by your health care provider. Make sure you discuss any questions you have with your health care provider.   Document Released: 05/26/2001 Document Revised: 06/22/2014 Document Reviewed: 08/02/2012 Elsevier Interactive Patient Education [employer]

## 2016-02-18 ENCOUNTER — Ambulatory Visit (INDEPENDENT_AMBULATORY_CARE_PROVIDER_SITE_OTHER): Payer: Medicaid Other | Admitting: Obstetrics and Gynecology

## 2016-02-18 ENCOUNTER — Encounter: Payer: Self-pay | Admitting: Obstetrics and Gynecology

## 2016-02-18 VITALS — BP 121/77 | HR 106 | Wt 235.0 lb

## 2016-02-18 DIAGNOSIS — O0932 Supervision of pregnancy with insufficient antenatal care, second trimester: Secondary | ICD-10-CM

## 2016-02-18 DIAGNOSIS — O34219 Maternal care for unspecified type scar from previous cesarean delivery: Secondary | ICD-10-CM | POA: Diagnosis not present

## 2016-02-18 DIAGNOSIS — Z3490 Encounter for supervision of normal pregnancy, unspecified, unspecified trimester: Secondary | ICD-10-CM | POA: Diagnosis not present

## 2016-02-18 NOTE — Progress Notes (Signed)
Patient states that she has had multiple visits in [place] she has been living for last few months. Armandina StammerJennifer Ethelle Ola RNBSN

## 2016-02-18 NOTE — Addendum Note (Signed)
Addended by: Anell BarrHOWARD, Geo Slone L on: 02/18/2016 03:05 PM   Modules accepted: Orders

## 2016-02-18 NOTE — Addendum Note (Signed)
Addended by: Hermina StaggersERVIN, Tyrika Newman L on: 02/18/2016 03:03 PM   Modules accepted: Orders

## 2016-02-18 NOTE — Patient Instructions (Signed)

## 2016-02-18 NOTE — Progress Notes (Signed)
Subjective:  Meredith [name] is a 25 y.o. [id number] at 6088w6d being seen today for initial prenatal care.  She reports her LMP was @ 08/28/15. Started her OB care in [place]. [place] the [place] now to attend school. She has a H/O GBS and C section. [facility] She has no complaints today  Patient reports no complaints.  Contractions: Not present. Vag. Bleeding: None.  Movement: Present. Denies leaking of fluid.   The following portions of the patient's history were reviewed and updated as appropriate: allergies, current medications, past family history, past medical history, past social history, past surgical history and problem list. Problem list updated.  Objective:   Vitals:   02/18/16 1351  BP: 121/77  Pulse: (!) 106  Weight: 235 lb (106.6 kg)    Fetal Status:     Movement: Present     General:  Alert, oriented and cooperative. Patient is in no acute distress.  Skin: Skin is warm and dry. No rash noted.   Cardiovascular: Normal heart rate noted  Respiratory: Normal respiratory effort, no problems with respiration noted  Abdomen: Soft, gravid, appropriate for gestational age. Pain/Pressure: Absent     Pelvic:  Cervical exam performed        Extremities: Normal range of motion.  Edema: None  Mental Status: Normal mood and affect. Normal behavior. Normal judgment and thought content.   Urinalysis: Urine Protein: Negative Urine Glucose: Negative  Assessment and Plan:  Pregnancy: [id number] at 6288w6d  1. Prenatal care, unspecified trimester  - Prenatal Profile I - HIV antibody (with reflex) - CULTURE, URINE COMPREHENSIVE - GC/Chlamydia Probe Amp - US OB Comp + 14 Wk; Future - Pap Lb, rfx HPV ASCU  2. Late prenatal care complicating pregnancy, second trimester U/S for dates and anantomy  3. History of cesarean delivery, currently pregnant  4. VBAC (vaginal birth after Cesarean) Will sign VBAC papers at next  visit  Preterm labor symptoms and general obstetric precautions including but not limited to vaginal bleeding, contractions, leaking of fluid and fetal movement were reviewed in detail with the patient. Please refer to After Visit Summary for other counseling recommendations.  Return in about 4 weeks (around 03/17/2016) for OB visit.   Hermina StaggersMichael L Ervin, MD

## 2016-02-19 LAB — PRENATAL PROFILE I(LABCORP)
ANTIBODY SCREEN: NEGATIVE
Basophils Absolute: 0 10*3/uL (ref 0.0–0.2)
Basos: 0 %
EOS (ABSOLUTE): 0.1 10*3/uL (ref 0.0–0.4)
Eos: 1 %
HEMOGLOBIN: 9.9 g/dL — AB (ref 11.1–15.9)
HEP B S AG: NEGATIVE
Hematocrit: 31 % — ABNORMAL LOW (ref 34.0–46.6)
IMMATURE GRANS (ABS): 0.1 10*3/uL (ref 0.0–0.1)
Immature Granulocytes: 1 %
LYMPHS: 21 %
Lymphocytes Absolute: 2 10*3/uL (ref 0.7–3.1)
MCH: 24.6 pg — ABNORMAL LOW (ref 26.6–33.0)
MCHC: 31.9 g/dL (ref 31.5–35.7)
MCV: 77 fL — ABNORMAL LOW (ref 79–97)
Monocytes Absolute: 0.6 10*3/uL (ref 0.1–0.9)
Monocytes: 6 %
Neutrophils Absolute: 7.2 10*3/uL — ABNORMAL HIGH (ref 1.4–7.0)
Neutrophils: 71 %
Platelets: 280 10*3/uL (ref 150–379)
RBC: 4.03 x10E6/uL (ref 3.77–5.28)
RDW: 14.6 % (ref 12.3–15.4)
RPR: NONREACTIVE
RUBELLA: 1.26 {index} (ref >0.99)
Rh Factor: POSITIVE
WBC: 9.9 10*3/uL (ref 3.4–10.8)

## 2016-02-19 LAB — HIV ANTIBODY (ROUTINE TESTING W REFLEX): HIV Screen 4th Generation wRfx: NONREACTIVE

## 2016-02-20 LAB — PAP LB, RFX HPV ASCU: PAP Smear Comment: 0

## 2016-02-21 LAB — CULTURE, URINE COMPREHENSIVE

## 2016-02-21 LAB — GC/CHLAMYDIA PROBE AMP
CHLAMYDIA, DNA PROBE: NEGATIVE
NEISSERIA GONORRHOEAE BY PCR: NEGATIVE

## 2016-02-24 ENCOUNTER — Telehealth: Payer: Self-pay | Admitting: *Deleted

## 2016-02-24 DIAGNOSIS — O99019 Anemia complicating pregnancy, unspecified trimester: Secondary | ICD-10-CM

## 2016-02-24 MED ORDER — FERROUS SULFATE DRIED ER 160 (50 FE) MG PO TBCR: 1.0000 | EXTENDED_RELEASE_TABLET | Freq: Every day | ORAL | 0 refills | Status: DC

## 2016-02-24 NOTE — Telephone Encounter (Signed)
Attempted to call patient. No answer, left message stating I am calling with lab results/info, please return call. Slow fe prescription to pharmacy.

## 2016-02-24 NOTE — Telephone Encounter (Signed)
-----   Message from Hermina StaggersMichael L Ervin, MD sent at 02/24/2016  2:36 PM EDT ----- Please notify pt of lab results and to start Slow Fe qd. Thanks Casimiro NeedleMichael ----- Message ----- From: Interface, [facility] Lab Results In Sent: 02/19/2016   7:43 AM To: Hermina StaggersMichael L Ervin, MD

## 2016-02-26 ENCOUNTER — Other Ambulatory Visit: Payer: Self-pay | Admitting: Obstetrics and Gynecology

## 2016-02-26 ENCOUNTER — Ambulatory Visit (HOSPITAL_COMMUNITY)
Admission: RE | Admit: 2016-02-26 | Discharge: 2016-02-26 | Disposition: A | Discharge status: Home / Self Care | Payer: Medicaid Other | Source: Ambulatory Visit | Attending: Obstetrics and Gynecology | Admitting: Obstetrics and Gynecology

## 2016-02-26 DIAGNOSIS — Z3490 Encounter for supervision of normal pregnancy, unspecified, unspecified trimester: Secondary | ICD-10-CM

## 2016-02-26 DIAGNOSIS — E669 Obesity, unspecified: Secondary | ICD-10-CM

## 2016-02-26 DIAGNOSIS — Z1389 Encounter for screening for other disorder: Secondary | ICD-10-CM

## 2016-02-26 DIAGNOSIS — Z363 Encounter for antenatal screening for malformations: Secondary | ICD-10-CM

## 2016-02-26 DIAGNOSIS — O0932 Supervision of pregnancy with insufficient antenatal care, second trimester: Secondary | ICD-10-CM | POA: Insufficient documentation

## 2016-02-26 DIAGNOSIS — O99213 Obesity complicating pregnancy, third trimester: Secondary | ICD-10-CM | POA: Diagnosis not present

## 2016-02-26 DIAGNOSIS — Z3A34 34 weeks gestation of pregnancy: Secondary | ICD-10-CM | POA: Diagnosis not present

## 2016-02-26 DIAGNOSIS — Z36 Encounter for antenatal screening of mother: Secondary | ICD-10-CM | POA: Insufficient documentation

## 2016-02-26 DIAGNOSIS — O26843 Uterine size-date discrepancy, third trimester: Secondary | ICD-10-CM

## 2016-02-26 DIAGNOSIS — O0933 Supervision of pregnancy with insufficient antenatal care, third trimester: Secondary | ICD-10-CM | POA: Insufficient documentation

## 2016-02-27 ENCOUNTER — Encounter: Payer: Self-pay | Admitting: General Practice

## 2016-03-03 NOTE — Telephone Encounter (Signed)
Attempted to reach patient. I have left a message for patient to call us back regarding results.

## 2016-03-04 NOTE — Telephone Encounter (Signed)
Was able to contact patient and informed her of anemia, slow fe prescription has been sent to her pharmacy. Patient voiced understanding.

## 2016-03-16 LAB — OB RESULTS CONSOLE GBS: GBS: POSITIVE

## 2016-03-17 ENCOUNTER — Ambulatory Visit: Payer: Medicaid Other | Admitting: Obstetrics and Gynecology

## 2016-03-17 VITALS — BP 121/80 | HR 105 | Temp 98.3°F | Wt 239.4 lb

## 2016-03-17 DIAGNOSIS — Z3A38 38 weeks gestation of pregnancy: Secondary | ICD-10-CM

## 2016-03-17 DIAGNOSIS — O34219 Maternal care for unspecified type scar from previous cesarean delivery: Secondary | ICD-10-CM

## 2016-03-17 DIAGNOSIS — O0933 Supervision of pregnancy with insufficient antenatal care, third trimester: Secondary | ICD-10-CM

## 2016-03-17 MED ORDER — TETANUS-DIPHTH-ACELL PERTUSSIS 5-2.5-18.5 LF-MCG/0.5 IM SUSP
0.5000 mL | Freq: Once | INTRAMUSCULAR | Status: AC
  Administered 2016-03-17: 0.5 mL via INTRAMUSCULAR

## 2016-03-17 NOTE — Progress Notes (Signed)
Subjective:  Meredith [name] is a 25 y.o. [id number] at 3763w2d being seen today for ongoing prenatal care.  She is currently monitored for the following issues for this low-risk pregnancy and has Late prenatal care complicating pregnancy; History of cesarean delivery, currently pregnant; VBAC (vaginal birth after Cesarean); and Prenatal care on her problem list.  Patient reports no complaints.  Contractions: Not present. Vag. Bleeding: None.  Movement: Present. Denies leaking of fluid.   The following portions of the patient's history were reviewed and updated as appropriate: allergies, current medications, past family history, past medical history, past social history, past surgical history and problem list. Problem list updated.  Objective:   Vitals:   03/17/16 0953  BP: 121/80  Pulse: (!) 105  Temp: 98.3 F (36.8 C)  Weight: 239 lb 6.4 oz (108.6 kg)    Fetal Status:     Movement: Present     General:  Alert, oriented and cooperative. Patient is in no acute distress.  Skin: Skin is warm and dry. No rash noted.   Cardiovascular: Normal heart rate noted  Respiratory: Normal respiratory effort, no problems with respiration noted  Abdomen: Soft, gravid, appropriate for gestational age. Pain/Pressure: Absent     Pelvic:  Cervical exam deferred        Extremities: Normal range of motion.  Edema: None  Mental Status: Normal mood and affect. Normal behavior. Normal judgment and thought content.   Urinalysis:      Assessment and Plan:  Pregnancy: [id number] at 5963w2d  1. Late prenatal care affecting pregnancy in third trimester EDD change form U/S however, OB appts were not adjusted - Glucose Tolerance, 2 Hours w/1 Hour H/O GBS will treat in labor Decline flu vaccine Tdap vaccine today  2. History of cesarean delivery, currently pregnant   3. VBAC (vaginal birth after Cesarean) Consent signed today  Term labor symptoms and general obstetric precautions including but not limited to  vaginal bleeding, contractions, leaking of fluid and fetal movement were reviewed in detail with the patient. Please refer to After Visit Summary for other counseling recommendations.  No Follow-up on file.   Hermina StaggersMichael L Jakyah Bradby, MD

## 2016-03-17 NOTE — Progress Notes (Signed)
Patient is in office and states that she is feeling good.

## 2016-03-18 ENCOUNTER — Encounter: Payer: Self-pay | Admitting: Obstetrics and Gynecology

## 2016-03-18 LAB — GLUCOSE TOLERANCE, 2 HOURS W/ 1HR
GLUCOSE, 1 HOUR: 112 mg/dL (ref 65–179)
Glucose, 2 hour: 89 mg/dL (ref 65–152)
Glucose, Fasting: 92 mg/dL — ABNORMAL HIGH (ref 65–91)

## 2016-03-25 ENCOUNTER — Ambulatory Visit (INDEPENDENT_AMBULATORY_CARE_PROVIDER_SITE_OTHER): Payer: Medicaid Other | Admitting: Obstetrics and Gynecology

## 2016-03-25 VITALS — BP 114/72 | HR 99 | Wt 247.0 lb

## 2016-03-25 DIAGNOSIS — O34219 Maternal care for unspecified type scar from previous cesarean delivery: Secondary | ICD-10-CM

## 2016-03-25 DIAGNOSIS — O0933 Supervision of pregnancy with insufficient antenatal care, third trimester: Secondary | ICD-10-CM

## 2016-03-25 NOTE — Progress Notes (Signed)
   Subjective:  Meredith [name] is a 25 y.o. [id number] at 2336w3d being seen today for ongoing prenatal care.  She is currently monitored for the following issues for this low-risk pregnancy and has Late prenatal care complicating pregnancy; History of cesarean delivery, currently pregnant; VBAC (vaginal birth after Cesarean); and Prenatal care on her problem list.  Patient reports no complaints.  Contractions: Not present. Vag. Bleeding: None.  Movement: Present. Denies leaking of fluid.   The following portions of the patient's history were reviewed and updated as appropriate: allergies, current medications, past family history, past medical history, past social history, past surgical history and problem list. Problem list updated.  Objective:   Vitals:   03/25/16 1429  BP: 114/72  Pulse: 99  Weight: 247 lb (112 kg)    Fetal Status: Fetal Heart Rate (bpm): 145   Movement: Present     General:  Alert, oriented and cooperative. Patient is in no acute distress.  Skin: Skin is warm and dry. No rash noted.   Cardiovascular: Normal heart rate noted  Respiratory: Normal respiratory effort, no problems with respiration noted  Abdomen: Soft, gravid, appropriate for gestational age. Pain/Pressure: Absent     Pelvic:  Cervical exam performed        Extremities: Normal range of motion.  Edema: Moderate pitting, indentation subsides rapidly  Mental Status: Normal mood and affect. Normal behavior. Normal judgment and thought content.   Urinalysis: Urine Protein: 1+ Urine Glucose: Negative  Assessment and Plan:  Pregnancy: [id number] at 2736w3d  1. Late prenatal care affecting pregnancy in third trimester H/O GBS will treat in labor Labor precautions reviewed 2. History of cesarean delivery, currently pregnant   3. VBAC (vaginal birth after Cesarean) Consent signed. Will schedule IOL at 41 weeks unless labor insues  Term labor symptoms and general obstetric precautions including but not limited  to vaginal bleeding, contractions, leaking of fluid and fetal movement were reviewed in detail with the patient. Please refer to After Visit Summary for other counseling recommendations.  No Follow-up on file.   Hermina StaggersMichael L Aivah Putman, MD

## 2016-03-25 NOTE — Patient Instructions (Signed)
Vaginal Delivery °During delivery, your health care provider will help you give birth to your baby. During a vaginal delivery, you will work to push the baby out of your vagina. However, before you can push your baby out, a few things need to happen. The opening of your uterus (cervix) has to soften, thin out, and open up (dilate) all the way to 10 cm. Also, your baby has to move down from the uterus into your vagina.  °SIGNS OF LABOR  °Your health care provider will first need to make sure you are in labor. Signs of labor include:  °· Passing what is called the mucous plug before labor begins. This is a small amount of blood-stained mucus. °· Having regular, painful uterine contractions.   °· The time between contractions gets shorter.   °· The discomfort and pain gradually get more intense. °· Contraction pains get worse when walking and do not go away when resting.   °· Your cervix becomes thinner (effacement) and dilates. °BEFORE THE DELIVERY °Once you are in labor and admitted into the hospital or care center, your health care provider may do the following:  °· Perform a complete physical exam. °· Review any complications related to pregnancy or labor.  °· Check your blood pressure, pulse, temperature, and heart rate (vital signs).   °· Determine if, and when, the rupture of amniotic membranes occurred. °· Do a vaginal exam (using a sterile glove and lubricant) to determine:   °¨ The position (presentation) of the baby. Is the baby's head presenting first (vertex) in the birth canal (vagina), or are the feet or buttocks first (breech)?   °¨ The level (station) of the baby's head within the birth canal.   °¨ The effacement and dilatation of the cervix.   °· An electronic fetal monitor is usually placed on your abdomen when you first arrive. This is used to monitor your contractions and the baby's heart rate. °¨ When the monitor is on your abdomen (external fetal monitor), it can only pick up the frequency and  length of your contractions. It cannot tell the strength of your contractions. °¨ If it becomes necessary for your health care provider to know exactly how strong your contractions are or to see exactly what the baby's heart rate is doing, an internal monitor may be inserted into your vagina and uterus. Your health care provider will discuss the benefits and risks of using an internal monitor and obtain your permission before inserting the device. °¨ Continuous fetal monitoring may be needed if you have an epidural, are receiving certain medicines (such as oxytocin), or have pregnancy or labor complications. °· An IV access tube may be placed into a vein in your arm to deliver fluids and medicines if necessary. °THREE STAGES OF LABOR AND DELIVERY °Normal labor and delivery is divided into three stages. °First Stage °This stage starts when you begin to contract regularly and your cervix begins to efface and dilate. It ends when your cervix is completely open (fully dilated). The first stage is the longest stage of labor and can last from 3 hours to 15 hours.  °Several methods are available to help with labor pain. You and your health care provider will decide which option is best for you. Options include:  °· Opioid medicines. These are strong pain medicines that you can get through your IV tube or as a shot into your muscle. These medicines lessen pain but do not make it go away completely.  °· Epidural. A medicine is given through a thin tube that   is inserted in your back. The medicine numbs the lower part of your body and prevents any pain in that area. °· Paracervical pain medicine. This is an injection of an anesthetic on each side of your cervix.   °· You may request natural childbirth, which does not involve the use of pain medicines or an epidural during labor and delivery. Instead, you will use other things, such as breathing exercises, to help cope with the pain. °Second Stage °The second stage of labor  begins when your cervix is fully dilated at 10 cm. It continues until you push your baby down through the birth canal and the baby is born. This stage can take only minutes or several hours. °· The location of your baby's head as it moves through the birth canal is reported as a number called a station. If the baby's head has not started its descent, the station is described as being at minus 3 (-3). When your baby's head is at the zero station, it is at the middle of the birth canal and is engaged in the pelvis. The station of your baby helps indicate the progress of the second stage of labor. °· When your baby is born, your health care provider may hold the baby with his or her head lowered to prevent amniotic fluid, mucus, and blood from getting into the baby's lungs. The baby's mouth and nose may be suctioned with a small bulb syringe to remove any additional fluid. °· Your health care provider may then place the baby on your stomach. It is important to keep the baby from getting cold. To do this, the health care provider will dry the baby off, place the baby directly on your skin (with no blankets between you and the baby), and cover the baby with warm, dry blankets.   °· The umbilical cord is cut. °Third Stage °During the third stage of labor, your health care provider will deliver the placenta (afterbirth) and make sure your bleeding is under control. The delivery of the placenta usually takes about 5 minutes but can take up to 30 minutes. After the placenta is delivered, a medicine may be given either by IV or injection to help contract the uterus and control bleeding. If you are planning to breastfeed, you can try to do so now. °After you deliver the placenta, your uterus should contract and get very firm. If your uterus does not remain firm, your health care provider will massage it. This is important because the contraction of the uterus helps cut off bleeding at the site where the placenta was attached  to your uterus. If your uterus does not contract properly and stay firm, you may continue to bleed heavily. If there is a lot of bleeding, medicines may be given to contract the uterus and stop the bleeding.  °  °This information is not intended to replace advice given to you by your health care provider. Make sure you discuss any questions you have with your health care provider. °  °Document Released: 03/10/2008 Document Revised: 06/22/2014 Document Reviewed: 01/27/2012 °Elsevier Interactive Patient Education ©2016 [employer] ° °

## 2016-03-26 ENCOUNTER — Telehealth (HOSPITAL_COMMUNITY): Payer: Self-pay | Admitting: *Deleted

## 2016-03-26 NOTE — Telephone Encounter (Signed)
Preadmission screen  

## 2016-04-01 ENCOUNTER — Other Ambulatory Visit (HOSPITAL_COMMUNITY)
Admission: RE | Admit: 2016-04-01 | Discharge: 2016-04-01 | Disposition: A | Discharge status: Home / Self Care | Payer: Medicaid Other | Source: Ambulatory Visit | Attending: Obstetrics and Gynecology | Admitting: Obstetrics and Gynecology

## 2016-04-01 ENCOUNTER — Ambulatory Visit (INDEPENDENT_AMBULATORY_CARE_PROVIDER_SITE_OTHER): Payer: Medicaid Other | Admitting: Obstetrics and Gynecology

## 2016-04-01 ENCOUNTER — Ambulatory Visit (HOSPITAL_COMMUNITY)
Admission: RE | Admit: 2016-04-01 | Discharge: 2016-04-01 | Disposition: A | Discharge status: Home / Self Care | Payer: Medicaid Other | Source: Ambulatory Visit | Attending: Obstetrics and Gynecology | Admitting: Obstetrics and Gynecology

## 2016-04-01 VITALS — BP 133/83 | HR 92 | Temp 99.1°F | Wt 251.4 lb

## 2016-04-01 DIAGNOSIS — Z3483 Encounter for supervision of other normal pregnancy, third trimester: Secondary | ICD-10-CM

## 2016-04-01 DIAGNOSIS — O99213 Obesity complicating pregnancy, third trimester: Secondary | ICD-10-CM | POA: Diagnosis not present

## 2016-04-01 DIAGNOSIS — Z3A39 39 weeks gestation of pregnancy: Secondary | ICD-10-CM | POA: Diagnosis not present

## 2016-04-01 DIAGNOSIS — O0933 Supervision of pregnancy with insufficient antenatal care, third trimester: Secondary | ICD-10-CM

## 2016-04-01 DIAGNOSIS — Z113 Encounter for screening for infections with a predominantly sexual mode of transmission: Secondary | ICD-10-CM | POA: Insufficient documentation

## 2016-04-01 DIAGNOSIS — O34219 Maternal care for unspecified type scar from previous cesarean delivery: Secondary | ICD-10-CM

## 2016-04-01 LAB — OB RESULTS CONSOLE GBS: GBS: NEGATIVE

## 2016-04-01 NOTE — Progress Notes (Signed)
Patient is in the office, states feeling good and reports good fetal movement.

## 2016-04-01 NOTE — Progress Notes (Signed)
   PRENATAL VISIT NOTE  Subjective:  Meredith [name] is a 25 y.o. [id number] at 3042w3d being seen today for ongoing prenatal care.  She is currently monitored for the following issues for this low-risk pregnancy and has History of cesarean delivery, currently pregnant; Prenatal care; and Encounter for supervision of other normal pregnancy, third trimester on her problem list.  Patient reports no complaints.  Contractions: Not present. Vag. Bleeding: None.  Movement: Present. Denies leaking of fluid.   The following portions of the patient's history were reviewed and updated as appropriate: allergies, current medications, past family history, past medical history, past social history, past surgical history and problem list. Problem list updated.  Objective:   Vitals:   04/01/16 1453  BP: 133/83  Pulse: 92  Temp: 99.1 F (37.3 C)  Weight: 251 lb 6.4 oz (114 kg)    Fetal Status: Fetal Heart Rate (bpm): 136 Fundal Height: 39 cm Movement: Present     General:  Alert, oriented and cooperative. Patient is in no acute distress.  Skin: Skin is warm and dry. No rash noted.   Cardiovascular: Normal heart rate noted  Respiratory: Normal respiratory effort, no problems with respiration noted  Abdomen: Soft, gravid, appropriate for gestational age. Pain/Pressure: Present     Pelvic:  Cervical exam performed Dilation: Fingertip Effacement (%): 50 Station: -3  Extremities: Normal range of motion.  Edema: Mild pitting, slight indentation  Mental Status: Normal mood and affect. Normal behavior. Normal judgment and thought content.   Assessment and Plan:  Pregnancy: A5W0981G5P2012 at 5642w3d  1. Late prenatal care affecting pregnancy in third trimester   2. Encounter for supervision of other normal pregnancy, third trimester Patient is doing well without complaints Cultures collected today NST reviewed and reactive  AFI scheduled for later this week Patient already scheduled for IOL on 10/22 - Culture,  beta strep (group b only) - GC/Chlamydia probe amp ([place]  3. History of cesarean delivery, currently pregnant Patient desires TOLAC  Term labor symptoms and general obstetric precautions including but not limited to vaginal bleeding, contractions, leaking of fluid and fetal movement were reviewed in detail with the patient. Please refer to After Visit Summary for other counseling recommendations.  No Follow-up on file.  Catalina AntiguaPeggy Anvi Mangal, MD

## 2016-04-02 ENCOUNTER — Telehealth: Payer: Self-pay | Admitting: *Deleted

## 2016-04-02 NOTE — Telephone Encounter (Signed)
-----   Message from Catalina AntiguaPeggy Constant, MD sent at 04/01/2016  5:27 PM EDT ----- Please inform patient that upon review of her chart, her due date is actually 10/25. Her induction of labor scheduled for this Sunday has been cancelled. She does not need to come in this week for NST but an ROB in 1 week needs to be scheduled if no spontaneous labor.  Please inform patient that we are doing this in her best interest. It gives her body more time to go in labor spontaneously which is the best was for a trial of labor after cesarean section  Please let me know when the patient has been notified  Thank you very much   MariemouthPeggy

## 2016-04-02 NOTE — Telephone Encounter (Signed)
Patient called back and left message on nurse line- Attempted to call patient back- no answer- left detailed message with request that she return call

## 2016-04-02 NOTE — Telephone Encounter (Signed)
Left message on voice mail to call office- chart was reviewed and changes have been made- please call and ask for Meredith [name]

## 2016-04-03 LAB — GC/CHLAMYDIA PROBE AMP (~~LOC~~) NOT AT ARMC
CHLAMYDIA, DNA PROBE: NEGATIVE
Neisseria Gonorrhea: NEGATIVE

## 2016-04-03 NOTE — Telephone Encounter (Signed)
Attempted to call patient again to follow up- LM on VM.

## 2016-04-04 NOTE — Progress Notes (Signed)
Several attempts has been made to contact the patient in order to  [facility] The patient original [facility] was 03/29/2016, upon review of the chart, the patient is dated by a 02/26/2016 ultrasound. The patient will be 6538w4d at the time of her scheduled induction on 04/05/16. This induction of labor should be rescheduled for a postdate induction on 04/15/2016 if the patient did not deliver.

## 2016-04-05 ENCOUNTER — Inpatient Hospital Stay (HOSPITAL_COMMUNITY)
Admission: RE | Admit: 2016-04-05 | Discharge: 2016-04-05 | Disposition: A | Discharge status: Home / Self Care | Payer: Medicaid Other | Source: Ambulatory Visit | Attending: Obstetrics and Gynecology | Admitting: Obstetrics and Gynecology

## 2016-04-05 LAB — CULTURE, BETA STREP (GROUP B ONLY): STREP GP B CULTURE: NEGATIVE

## 2016-04-07 ENCOUNTER — Ambulatory Visit (INDEPENDENT_AMBULATORY_CARE_PROVIDER_SITE_OTHER): Payer: Medicaid Other | Admitting: Obstetrics & Gynecology

## 2016-04-07 VITALS — BP 125/77 | HR 89 | Temp 97.4°F | Wt 251.4 lb

## 2016-04-07 DIAGNOSIS — Z3483 Encounter for supervision of other normal pregnancy, third trimester: Secondary | ICD-10-CM | POA: Diagnosis not present

## 2016-04-07 NOTE — Patient Instructions (Signed)
Vaginal Delivery °During delivery, your health care provider will help you give birth to your baby. During a vaginal delivery, you will work to push the baby out of your vagina. However, before you can push your baby out, a few things need to happen. The opening of your uterus (cervix) has to soften, thin out, and open up (dilate) all the way to 10 cm. Also, your baby has to move down from the uterus into your vagina.  °SIGNS OF LABOR  °Your health care provider will first need to make sure you are in labor. Signs of labor include:  °· Passing what is called the mucous plug before labor begins. This is a small amount of blood-stained mucus. °· Having regular, painful uterine contractions.   °· The time between contractions gets shorter.   °· The discomfort and pain gradually get more intense. °· Contraction pains get worse when walking and do not go away when resting.   °· Your cervix becomes thinner (effacement) and dilates. °BEFORE THE DELIVERY °Once you are in labor and admitted into the hospital or care center, your health care provider may do the following:  °· Perform a complete physical exam. °· Review any complications related to pregnancy or labor.  °· Check your blood pressure, pulse, temperature, and heart rate (vital signs).   °· Determine if, and when, the rupture of amniotic membranes occurred. °· Do a vaginal exam (using a sterile glove and lubricant) to determine:   °¨ The position (presentation) of the baby. Is the baby's head presenting first (vertex) in the birth canal (vagina), or are the feet or buttocks first (breech)?   °¨ The level (station) of the baby's head within the birth canal.   °¨ The effacement and dilatation of the cervix.   °· An electronic fetal monitor is usually placed on your abdomen when you first arrive. This is used to monitor your contractions and the baby's heart rate. °¨ When the monitor is on your abdomen (external fetal monitor), it can only pick up the frequency and  length of your contractions. It cannot tell the strength of your contractions. °¨ If it becomes necessary for your health care provider to know exactly how strong your contractions are or to see exactly what the baby's heart rate is doing, an internal monitor may be inserted into your vagina and uterus. Your health care provider will discuss the benefits and risks of using an internal monitor and obtain your permission before inserting the device. °¨ Continuous fetal monitoring may be needed if you have an epidural, are receiving certain medicines (such as oxytocin), or have pregnancy or labor complications. °· An IV access tube may be placed into a vein in your arm to deliver fluids and medicines if necessary. °THREE STAGES OF LABOR AND DELIVERY °Normal labor and delivery is divided into three stages. °First Stage °This stage starts when you begin to contract regularly and your cervix begins to efface and dilate. It ends when your cervix is completely open (fully dilated). The first stage is the longest stage of labor and can last from 3 hours to 15 hours.  °Several methods are available to help with labor pain. You and your health care provider will decide which option is best for you. Options include:  °· Opioid medicines. These are strong pain medicines that you can get through your IV tube or as a shot into your muscle. These medicines lessen pain but do not make it go away completely.  °· Epidural. A medicine is given through a thin tube that   is inserted in your back. The medicine numbs the lower part of your body and prevents any pain in that area. °· Paracervical pain medicine. This is an injection of an anesthetic on each side of your cervix.   °· You may request natural childbirth, which does not involve the use of pain medicines or an epidural during labor and delivery. Instead, you will use other things, such as breathing exercises, to help cope with the pain. °Second Stage °The second stage of labor  begins when your cervix is fully dilated at 10 cm. It continues until you push your baby down through the birth canal and the baby is born. This stage can take only minutes or several hours. °· The location of your baby's head as it moves through the birth canal is reported as a number called a station. If the baby's head has not started its descent, the station is described as being at minus 3 (-3). When your baby's head is at the zero station, it is at the middle of the birth canal and is engaged in the pelvis. The station of your baby helps indicate the progress of the second stage of labor. °· When your baby is born, your health care provider may hold the baby with his or her head lowered to prevent amniotic fluid, mucus, and blood from getting into the baby's lungs. The baby's mouth and nose may be suctioned with a small bulb syringe to remove any additional fluid. °· Your health care provider may then place the baby on your stomach. It is important to keep the baby from getting cold. To do this, the health care provider will dry the baby off, place the baby directly on your skin (with no blankets between you and the baby), and cover the baby with warm, dry blankets.   °· The umbilical cord is cut. °Third Stage °During the third stage of labor, your health care provider will deliver the placenta (afterbirth) and make sure your bleeding is under control. The delivery of the placenta usually takes about 5 minutes but can take up to 30 minutes. After the placenta is delivered, a medicine may be given either by IV or injection to help contract the uterus and control bleeding. If you are planning to breastfeed, you can try to do so now. °After you deliver the placenta, your uterus should contract and get very firm. If your uterus does not remain firm, your health care provider will massage it. This is important because the contraction of the uterus helps cut off bleeding at the site where the placenta was attached  to your uterus. If your uterus does not contract properly and stay firm, you may continue to bleed heavily. If there is a lot of bleeding, medicines may be given to contract the uterus and stop the bleeding.  °  °This information is not intended to replace advice given to you by your health care provider. Make sure you discuss any questions you have with your health care provider. °  °Document Released: 03/10/2008 Document Revised: 06/22/2014 Document Reviewed: 01/27/2012 °Elsevier Interactive Patient Education ©2016 [employer] ° °

## 2016-04-07 NOTE — Progress Notes (Signed)
   PRENATAL VISIT NOTE  Subjective:  Meredith [name] is a 25 y.o. [id number] at 5060w6d being seen today for ongoing prenatal care.  She is currently monitored for the following issues for this low-risk pregnancy and has History of cesarean delivery, currently pregnant; Prenatal care; and Encounter for supervision of other normal pregnancy, third trimester on her problem list.  Patient reports no complaints.  Contractions: Not present. Vag. Bleeding: None.  Movement: Present. Denies leaking of fluid.   The following portions of the patient's history were reviewed and updated as appropriate: allergies, current medications, past family history, past medical history, past social history, past surgical history and problem list. Problem list updated.  Objective:   Vitals:   04/07/16 1029  BP: 125/77  Pulse: 89  Temp: 97.4 F (36.3 C)  Weight: 251 lb 6.4 oz (114 kg)    Fetal Status: Fetal Heart Rate (bpm): 157 Fundal Height: 36 cm Movement: Present     General:  Alert, oriented and cooperative. Patient is in no acute distress.  Skin: Skin is warm and dry. No rash noted.   Cardiovascular: Normal heart rate noted  Respiratory: Normal respiratory effort, no problems with respiration noted  Abdomen: Soft, gravid, appropriate for gestational age. Pain/Pressure: Present     Pelvic:  Cervical exam deferred        Extremities: Normal range of motion.  Edema: Mild pitting, slight indentation  Mental Status: Normal mood and affect. Normal behavior. Normal judgment and thought content.   Assessment and Plan:  Pregnancy: [id number] at 1360w6d Patient Active Problem List   Diagnosis Date Noted  . Encounter for supervision of other normal pregnancy, third trimester 04/01/2016  . Prenatal care 02/18/2016  . History of cesarean delivery, currently pregnant 03/31/2013    There are no diagnoses linked to this encounter. Term labor symptoms and general obstetric precautions including but not limited to vaginal  bleeding, contractions, leaking of fluid and fetal movement were reviewed in detail with the patient. Please refer to After Visit Summary for other counseling recommendations.  Return in about 3 days (around 04/10/2016) for NST and exam. IOL at 41 weeks by US Adam PhenixJames G Arnold, MD

## 2016-04-07 NOTE — Progress Notes (Signed)
Patient is in office for ob visit, states that she is feeling good, reports good fetal movement.

## 2016-04-10 ENCOUNTER — Ambulatory Visit (INDEPENDENT_AMBULATORY_CARE_PROVIDER_SITE_OTHER): Payer: Medicaid Other | Admitting: Obstetrics and Gynecology

## 2016-04-10 VITALS — BP 118/78 | HR 96 | Temp 97.7°F | Wt 252.0 lb

## 2016-04-10 DIAGNOSIS — Z3483 Encounter for supervision of other normal pregnancy, third trimester: Secondary | ICD-10-CM | POA: Diagnosis not present

## 2016-04-10 DIAGNOSIS — Z3A40 40 weeks gestation of pregnancy: Secondary | ICD-10-CM

## 2016-04-10 NOTE — Progress Notes (Signed)
Patient is in the office for NST, reports good fetal movement.

## 2016-04-15 ENCOUNTER — Inpatient Hospital Stay (HOSPITAL_COMMUNITY): Payer: Medicaid Other | Admitting: Anesthesiology

## 2016-04-15 ENCOUNTER — Encounter (HOSPITAL_COMMUNITY): Payer: Self-pay

## 2016-04-15 ENCOUNTER — Other Ambulatory Visit: Payer: Self-pay | Admitting: Advanced Practice Midwife

## 2016-04-15 ENCOUNTER — Inpatient Hospital Stay (HOSPITAL_COMMUNITY)
Admission: RE | Admit: 2016-04-15 | Discharge: 2016-04-17 | DRG: 775 | Disposition: A | Discharge status: Home / Self Care | Payer: Medicaid Other | Source: Ambulatory Visit | Attending: Obstetrics & Gynecology | Admitting: Obstetrics & Gynecology

## 2016-04-15 DIAGNOSIS — O99824 Streptococcus B carrier state complicating childbirth: Secondary | ICD-10-CM | POA: Diagnosis present

## 2016-04-15 DIAGNOSIS — O480 Post-term pregnancy: Secondary | ICD-10-CM | POA: Diagnosis present

## 2016-04-15 DIAGNOSIS — Z3A41 41 weeks gestation of pregnancy: Secondary | ICD-10-CM | POA: Diagnosis not present

## 2016-04-15 DIAGNOSIS — Z87891 Personal history of nicotine dependence: Secondary | ICD-10-CM | POA: Diagnosis not present

## 2016-04-15 DIAGNOSIS — O34211 Maternal care for low transverse scar from previous cesarean delivery: Secondary | ICD-10-CM | POA: Diagnosis present

## 2016-04-15 DIAGNOSIS — O99214 Obesity complicating childbirth: Secondary | ICD-10-CM | POA: Diagnosis present

## 2016-04-15 DIAGNOSIS — Z6841 Body Mass Index (BMI) 40.0 and over, adult: Secondary | ICD-10-CM | POA: Diagnosis not present

## 2016-04-15 LAB — TYPE AND SCREEN
ABO/RH(D): O POS
ANTIBODY SCREEN: NEGATIVE

## 2016-04-15 LAB — CBC
HEMATOCRIT: 30.8 % — AB (ref 36.0–46.0)
Hemoglobin: 9.7 g/dL — ABNORMAL LOW (ref 12.0–15.0)
MCH: 23.1 pg — ABNORMAL LOW (ref 26.0–34.0)
MCHC: 31.5 g/dL (ref 30.0–36.0)
MCV: 73.3 fL — AB (ref 78.0–100.0)
Platelets: 193 10*3/uL (ref 150–400)
RBC: 4.2 MIL/uL (ref 3.87–5.11)
RDW: 15.4 % (ref 11.5–15.5)
WBC: 10.3 10*3/uL (ref 4.0–10.5)

## 2016-04-15 LAB — RPR: RPR Ser Ql: NONREACTIVE

## 2016-04-15 MED ORDER — OXYCODONE-ACETAMINOPHEN 5-325 MG PO TABS: 2.0000 | ORAL_TABLET | ORAL | Status: DC | PRN

## 2016-04-15 MED ORDER — TERBUTALINE SULFATE 1 MG/ML IJ SOLN
0.2500 mg | Freq: Once | INTRAMUSCULAR | Status: DC | PRN
  Filled 2016-04-15: qty 1

## 2016-04-15 MED ORDER — PENICILLIN G POTASSIUM 5000000 UNITS IJ SOLR
5.0000 10*6.[IU] | Freq: Once | INTRAVENOUS | Status: AC
  Administered 2016-04-15: 5 10*6.[IU] via INTRAVENOUS
  Filled 2016-04-15: qty 5

## 2016-04-15 MED ORDER — LACTATED RINGERS IV SOLN
INTRAVENOUS | Status: DC
  Administered 2016-04-15: 09:00:00 via INTRAVENOUS

## 2016-04-15 MED ORDER — ONDANSETRON HCL 4 MG/2ML IJ SOLN: 4.0000 mg | Freq: Four times a day (QID) | INTRAMUSCULAR | Status: DC | PRN

## 2016-04-15 MED ORDER — LACTATED RINGERS IV SOLN: 500.0000 mL | Freq: Once | INTRAVENOUS | Status: DC

## 2016-04-15 MED ORDER — FENTANYL 2.5 MCG/ML BUPIVACAINE 1/10 % EPIDURAL INFUSION (WH - ANES)
14.0000 mL/h | INTRAMUSCULAR | Status: DC | PRN
  Administered 2016-04-15: 12 mL/h via EPIDURAL
  Filled 2016-04-15: qty 125

## 2016-04-15 MED ORDER — DIPHENHYDRAMINE HCL 50 MG/ML IJ SOLN: 12.5000 mg | INTRAMUSCULAR | Status: DC | PRN

## 2016-04-15 MED ORDER — EPHEDRINE 5 MG/ML INJ
10.0000 mg | INTRAVENOUS | Status: DC | PRN
  Filled 2016-04-15: qty 4

## 2016-04-15 MED ORDER — LIDOCAINE HCL (PF) 1 % IJ SOLN
30.0000 mL | INTRAMUSCULAR | Status: DC | PRN
Start: 2016-04-15 — End: 2016-04-16
  Filled 2016-04-15: qty 30

## 2016-04-15 MED ORDER — OXYTOCIN 40 UNITS IN LACTATED RINGERS INFUSION - SIMPLE MED
1.0000 m[IU]/min | INTRAVENOUS | Status: DC
  Administered 2016-04-15: 2 m[IU]/min via INTRAVENOUS

## 2016-04-15 MED ORDER — LIDOCAINE HCL (PF) 1 % IJ SOLN
INTRAMUSCULAR | Status: DC | PRN
  Administered 2016-04-15: 6 mL via EPIDURAL
  Administered 2016-04-15: 4 mL via EPIDURAL

## 2016-04-15 MED ORDER — SOD CITRATE-CITRIC ACID 500-334 MG/5ML PO SOLN: 30.0000 mL | ORAL | Status: DC | PRN

## 2016-04-15 MED ORDER — LACTATED RINGERS IV SOLN: 500.0000 mL | INTRAVENOUS | Status: DC | PRN

## 2016-04-15 MED ORDER — PHENYLEPHRINE 40 MCG/ML (10ML) SYRINGE FOR IV PUSH (FOR BLOOD PRESSURE SUPPORT)
80.0000 ug | PREFILLED_SYRINGE | INTRAVENOUS | Status: DC | PRN
  Filled 2016-04-15: qty 5
  Filled 2016-04-15: qty 10

## 2016-04-15 MED ORDER — OXYTOCIN 40 UNITS IN LACTATED RINGERS INFUSION - SIMPLE MED
2.5000 [IU]/h | INTRAVENOUS | Status: DC
  Filled 2016-04-15: qty 1000

## 2016-04-15 MED ORDER — PHENYLEPHRINE 40 MCG/ML (10ML) SYRINGE FOR IV PUSH (FOR BLOOD PRESSURE SUPPORT)
80.0000 ug | PREFILLED_SYRINGE | INTRAVENOUS | Status: DC | PRN
  Filled 2016-04-15: qty 5

## 2016-04-15 MED ORDER — MISOPROSTOL 25 MCG QUARTER TABLET
25.0000 ug | ORAL_TABLET | ORAL | Status: DC | PRN
  Administered 2016-04-15: 25 ug via VAGINAL
  Filled 2016-04-15: qty 0.25
  Filled 2016-04-15: qty 1

## 2016-04-15 MED ORDER — ACETAMINOPHEN 325 MG PO TABS: 650.0000 mg | ORAL_TABLET | ORAL | Status: DC | PRN

## 2016-04-15 MED ORDER — OXYTOCIN BOLUS FROM INFUSION
500.0000 mL | Freq: Once | INTRAVENOUS | Status: AC
  Administered 2016-04-16: 500 mL via INTRAVENOUS

## 2016-04-15 MED ORDER — PENICILLIN G POTASSIUM 5000000 UNITS IJ SOLR
2.5000 10*6.[IU] | INTRAVENOUS | Status: DC
  Administered 2016-04-15: 2.5 10*6.[IU] via INTRAVENOUS
  Filled 2016-04-15 (×4): qty 2.5

## 2016-04-15 MED ORDER — FENTANYL CITRATE (PF) 100 MCG/2ML IJ SOLN: 100.0000 ug | INTRAMUSCULAR | Status: DC | PRN

## 2016-04-15 MED ORDER — OXYCODONE-ACETAMINOPHEN 5-325 MG PO TABS: 1.0000 | ORAL_TABLET | ORAL | Status: DC | PRN

## 2016-04-15 MED ORDER — FLEET ENEMA 7-19 GM/118ML RE ENEM: 1.0000 | ENEMA | RECTAL | Status: DC | PRN

## 2016-04-15 NOTE — Anesthesia Preprocedure Evaluation (Signed)
Anesthesia Evaluation  Patient identified by MRN, date of birth, ID band Patient awake    Reviewed: Allergy & Precautions, NPO status , Patient's Chart, lab work & pertinent test results  History of Anesthesia Complications Negative for: history of anesthetic complications  Airway Mallampati: III  TM Distance: >3 FB Neck ROM: Full    Dental  (+) Dental Advisory Given   Pulmonary former smoker,    breath sounds clear to auscultation       Cardiovascular negative cardio ROS   Rhythm:Regular Rate:Normal     Neuro/Psych    GI/Hepatic negative GI ROS, Neg liver ROS,   Endo/Other  Morbid obesity  Renal/GU negative Renal ROS     Musculoskeletal   Abdominal (+) + obese,   Peds  Hematology plt 193k   Anesthesia Other Findings   Reproductive/Obstetrics (+) Pregnancy                             Anesthesia Physical Anesthesia Plan  ASA: II  Anesthesia Plan: Epidural   Post-op Pain Management:    Induction:   Airway Management Planned: Natural Airway  Additional Equipment:   Intra-op Plan:   Post-operative Plan:   Informed Consent: I have reviewed the patients History and Physical, chart, labs and discussed the procedure including the risks, benefits and alternatives for the proposed anesthesia with the patient or authorized representative who has indicated his/her understanding and acceptance.     Plan Discussed with:   Anesthesia Plan Comments: (Patient identified. Risks/Benefits/Options discussed with patient including but not limited to bleeding, infection, nerve damage, paralysis, failed block, incomplete pain control, headache, blood pressure changes, nausea, vomiting, reactions to medication both or allergic, itching and postpartum back pain. Confirmed with bedside nurse the patient's most recent platelet count. Confirmed with patient that they are not currently taking any  anticoagulation, have any bleeding history or any family history of bleeding disorders. Patient expressed understanding and wished to proceed. All questions were answered. )        Anesthesia Quick Evaluation

## 2016-04-15 NOTE — Anesthesia Pain Management Evaluation Note (Signed)
  CRNA Pain Management Visit Note  Patient: Meredith [name], 25 y.o., female  "Hello [facility] am [facility]. We have an anesthesia team available at all times to provide care throughout the hospital, including epidural management and anesthesia for C-section. I don't know your plan for the delivery whether it a natural birth, water birth, IV sedation, nitrous supplementation, doula or epidural, but we want to meet your pain goals."   1.Was your pain managed to your expectations on prior hospitalizations?   Yes   2.What is your expectation for pain management during this hospitalization?     Epidural  3.How can we help you reach that goal?   Record the patient's initial score and the patient's pain goal.   Pain: 0  Pain Goal: 6 [facility] wants you to be able to say your pain was always managed very well.  Laban EmperorMalinova,Yaneli Keithley Hristova 04/15/2016

## 2016-04-15 NOTE — Progress Notes (Signed)
Pt seen doing well. Getting uncomfortable. Requesting epidural. No other complaints. Cervix 5/50/-3. AROM, no fluid. When comfortable will recheck. Category 1 tracing.

## 2016-04-15 NOTE — H&P (Signed)
LABOR AND DELIVERY ADMISSION HISTORY AND PHYSICAL NOTE  Meredith [name] is a 25 y.o. female [phone] VBAC with IUP at 5349w0d by US presenting for IOL.   She is feeling well.  Denies any pain or current contractions.   She reports positive fetal movement. She denies leakage of fluid or vaginal bleeding.  Prenatal History/Complications:  Past Medical History: Past Medical History:  Diagnosis Date  . No pertinent past medical history     Past Surgical History: Past Surgical History:  Procedure Laterality Date  . CESAREAN SECTION  04/29/2011   Procedure: CESAREAN SECTION;  Surgeon: Roseanna RainbowLisa A Jackson-Moore, MD;  Location: [facility];  Service: Gynecology;  Laterality: N/A;  Primary Cesarian Section    Obstetrical History: OB History    Gravida Para Term Preterm AB Living   5 2 2   1 2    SAB TAB Ectopic Multiple Live Births   1       2      Social History: Social History   Social History  . Marital status: Single    Spouse name: N/A  . Number of children: N/A  . Years of education: N/A   Social History Main Topics  . Smoking status: Former Smoker    Packs/day: 0.25    Types: Cigarettes    Quit date: 03/05/2015  . Smokeless tobacco: Never Used  . Alcohol use No  . Drug use: No  . Sexual activity: Yes    Birth control/ protection: None   Other Topics Concern  . None   Social History Narrative   Marital status: married since 12/2015; from [place]; family from [place]      Children: 2 children (4 yo, 3yo)      Lives: with husband, 2 children      Employment: homemaker      Tobacco: none      Alcohol: socially/weekends      Drugs: none      Exercise: walking    Family History: Family History  Problem Relation Age of Onset  . Cancer Maternal Grandmother   . Diabetes Neg Hx   . Hyperlipidemia Neg Hx   . Hypertension Neg Hx   . Kidney disease Neg Hx   . Stroke Neg Hx     Allergies: No Known Allergies  Prescriptions Prior to Admission  Medication Sig Dispense Refill  Last Dose  . ferrous sulfate (SLOW IRON) 160 (50 Fe) MG TBCR SR tablet Take 1 tablet (160 mg total) by mouth daily. 30 each 0 04/14/2016 at Unknown time  . Prenatal Vit-Fe Fumarate-FA (PRENATAL MULTIVITAMIN) TABS tablet Take 1 tablet by mouth daily at 12 noon.   04/14/2016 at Unknown time     Review of Systems   All systems reviewed and negative except as stated in HPI  Blood pressure (!) 147/81, pulse 92, temperature 98.2 F (36.8 C), temperature source Oral, resp. rate 18, height 5\' 3"  (1.6 m), weight 252 lb (114.3 kg), last menstrual period 08/28/2015, currently breastfeeding. General appearance: alert, cooperative, appears stated age and no distress Lungs: no respiratory distress Heart: regular rate Abdomen: soft, non-tender Extremities: No calf swelling or tenderness Uterine activity: Patient reports mild contractions x 2 days Dilation: 3 (removed cytotec) Effacement (%): 50 Station: -3 Exam by:: lee   Prenatal labs: ABO, Rh: --/--/O POS (11/01 0845) Antibody: PENDING (11/01 0845) Rubella: Immune RPR: Non Reactive (09/05 1614)  HBsAg: Negative (09/05 1614)  HIV: Non Reactive (09/05 1614)  GBS: Positive (10/02 0000)  Genetic screening:  No Anatomy US: 02/26/16  Prenatal Transfer Tool  Maternal Diabetes: No Genetic Screening: Declined Maternal Ultrasounds/Referrals: Normal Fetal Ultrasounds or other Referrals:  None Maternal Substance Abuse:  No Significant Maternal Medications:  None Significant Maternal Lab Results: None  Results for orders placed or performed during the hospital encounter of 04/15/16 (from the past 24 hour(s))  CBC   Collection Time: 04/15/16  8:45 AM  Result Value Ref Range   WBC 10.3 4.0 - 10.5 K/uL   RBC 4.20 3.87 - 5.11 MIL/uL   Hemoglobin 9.7 (L) 12.0 - 15.0 g/dL   HCT 16.130.8 (L) 09.636.0 - 04.546.0 %   MCV 73.3 (L) 78.0 - 100.0 fL   MCH 23.1 (L) 26.0 - 34.0 pg   MCHC 31.5 30.0 - 36.0 g/dL   RDW 40.915.4 81.111.5 - 91.415.5 %   Platelets 193 150 - 400 K/uL   Type and screen [facility]   Collection Time: 04/15/16  8:45 AM  Result Value Ref Range   ABO/RH(D) O POS    Antibody Screen PENDING    Sample Expiration 04/18/2016     Patient Active Problem List   Diagnosis Date Noted  . Post-dates pregnancy 04/15/2016  . Encounter for supervision of other normal pregnancy, third trimester 04/01/2016  . Prenatal care 02/18/2016  . History of cesarean delivery, currently pregnant 03/31/2013    Assessment: Meredith [name] is a 25 y.o. [id number] VBAC at 2361w0d here for IOL.    #Labor:IOL at 5961w0d #Pain: Currently undecided on desire for pain control #FWB: Category 1 tracing #ID:  GBS negative #MOF: Breast and bottle #MOC:Mirena  Ernestina PennaNicholas Avarae Zwart MD

## 2016-04-15 NOTE — Anesthesia Procedure Notes (Signed)
Epidural Patient location during procedure: OB Start time: 04/15/2016 7:36 PM End time: 04/15/2016 7:54 PM  Staffing Anesthesiologist: Jairo BenJACKSON, Clydean Posas Performed: anesthesiologist   Preanesthetic Checklist Completed: patient identified, surgical consent, pre-op evaluation, timeout performed, IV checked, risks and benefits discussed and monitors and equipment checked  Epidural Patient position: sitting Prep: site prepped and draped and DuraPrep Patient monitoring: blood pressure, continuous pulse ox and heart rate Approach: midline Location: L2-L3 Injection technique: LOR air  Needle:  Needle type: Tuohy  Needle gauge: 17 G Needle length: 9 cm Needle insertion depth: 6 cm Catheter type: closed end flexible Catheter size: 19 Gauge Catheter at skin depth: 12 cm Test dose: negative (1% lidocaine)  Assessment Events: blood not aspirated, injection not painful, no injection resistance, negative IV test and no paresthesia  Additional Notes Pt identified in Labor room.  Monitors applied. Working IV access confirmed. Sterile prep, drape lumbar spine.  1% lido local L 2,3.  #17ga Touhy LOR air at 6 cm L 2,3, cath in easily to 12 cm skin. Test dose OK, cath dosed and infusion begun.  Patient asymptomatic, VSS, no heme aspirated, tolerated well.  Sandford Craze Warden Buffa, MDReason for block:procedure for pain

## 2016-04-15 NOTE — Progress Notes (Signed)
Patient ID: Meredith [name], female   DOB: 01/26/1991, 25 y.o.   MRN: [id number]  Comfortable w/ epidural VSS, afeb FHR 130s, +accels, occ mi variables Ctx q 2-4 mins w/ Pit @ 5616mu/min Cx 5-6/50/-3  IUP@term  TOLAC Early active labor  IUPC inserted without difficulty by Dr Evelene CroonSantos Will titrate Pit to adequate MVUs Anticipate SVD  Cam HaiSHAW, Jefferie Holston  CNM 04/15/2016 9:34 PM

## 2016-04-16 ENCOUNTER — Encounter (HOSPITAL_COMMUNITY): Payer: Self-pay

## 2016-04-16 DIAGNOSIS — Z3A41 41 weeks gestation of pregnancy: Secondary | ICD-10-CM

## 2016-04-16 DIAGNOSIS — O480 Post-term pregnancy: Secondary | ICD-10-CM

## 2016-04-16 DIAGNOSIS — O34211 Maternal care for low transverse scar from previous cesarean delivery: Secondary | ICD-10-CM

## 2016-04-16 MED ORDER — WITCH HAZEL-GLYCERIN EX PADS: 1.0000 "application " | MEDICATED_PAD | CUTANEOUS | Status: DC | PRN

## 2016-04-16 MED ORDER — INFLUENZA VAC SPLIT QUAD 0.5 ML IM SUSY
0.5000 mL | PREFILLED_SYRINGE | INTRAMUSCULAR | Status: AC
  Administered 2016-04-16: 0.5 mL via INTRAMUSCULAR

## 2016-04-16 MED ORDER — PRENATAL MULTIVITAMIN CH
1.0000 | ORAL_TABLET | Freq: Every day | ORAL | Status: DC
  Administered 2016-04-16: 1 via ORAL
  Filled 2016-04-16: qty 1

## 2016-04-16 MED ORDER — BENZOCAINE-MENTHOL 20-0.5 % EX AERO: 1.0000 "application " | INHALATION_SPRAY | CUTANEOUS | Status: DC | PRN

## 2016-04-16 MED ORDER — SENNOSIDES-DOCUSATE SODIUM 8.6-50 MG PO TABS
2.0000 | ORAL_TABLET | ORAL | Status: DC
  Filled 2016-04-16: qty 2

## 2016-04-16 MED ORDER — TETANUS-DIPHTH-ACELL PERTUSSIS 5-2.5-18.5 LF-MCG/0.5 IM SUSP: 0.5000 mL | Freq: Once | INTRAMUSCULAR | Status: DC

## 2016-04-16 MED ORDER — ONDANSETRON HCL 4 MG PO TABS: 4.0000 mg | ORAL_TABLET | ORAL | Status: DC | PRN

## 2016-04-16 MED ORDER — DIBUCAINE 1 % RE OINT: 1.0000 "application " | TOPICAL_OINTMENT | RECTAL | Status: DC | PRN

## 2016-04-16 MED ORDER — IBUPROFEN 600 MG PO TABS
600.0000 mg | ORAL_TABLET | Freq: Four times a day (QID) | ORAL | Status: DC
  Administered 2016-04-16 – 2016-04-17 (×4): 600 mg via ORAL
  Filled 2016-04-16 (×5): qty 1

## 2016-04-16 MED ORDER — ZOLPIDEM TARTRATE 5 MG PO TABS: 5.0000 mg | ORAL_TABLET | Freq: Every evening | ORAL | Status: DC | PRN

## 2016-04-16 MED ORDER — SIMETHICONE 80 MG PO CHEW: 80.0000 mg | CHEWABLE_TABLET | ORAL | Status: DC | PRN

## 2016-04-16 MED ORDER — DIPHENHYDRAMINE HCL 25 MG PO CAPS: 25.0000 mg | ORAL_CAPSULE | Freq: Four times a day (QID) | ORAL | Status: DC | PRN

## 2016-04-16 MED ORDER — ONDANSETRON HCL 4 MG/2ML IJ SOLN: 4.0000 mg | INTRAMUSCULAR | Status: DC | PRN

## 2016-04-16 MED ORDER — ACETAMINOPHEN 325 MG PO TABS: 650.0000 mg | ORAL_TABLET | ORAL | Status: DC | PRN

## 2016-04-16 MED ORDER — COCONUT OIL OIL: 1.0000 "application " | TOPICAL_OIL | Status: DC | PRN

## 2016-04-16 NOTE — Lactation Note (Signed)
This note was copied from a baby's chart. Lactation Consultation Note  P3, BF other children for 4 months and supplemented formula. Plans to breastfeed and formula feed. Mother states baby likes to bf more than bottle feed. Mom encouraged to feed baby 8-12 times/24 hours and with feeding cues.  Mom made aware of O/P services, breastfeeding support groups, community resources, and our phone # for post-discharge questions.   Patient Name: Girl Laqueta Duemily Thalman WUJWJ'XToday's Date: 04/16/2016 Reason for consult: Initial assessment   Maternal Data Has patient been taught Hand Expression?:  (states she knows how) Does the patient have breastfeeding experience prior to this delivery?: Yes  Feeding Feeding Type: Breast Fed Length of feed: 10 min  LATCH Score/Interventions                      Lactation Tools Discussed/Used     Consult Status Consult Status: Follow-up Date: 04/17/16 Follow-up type: In-patient    Dahlia ByesBerkelhammer, Maikel Neisler [facility] 04/16/2016, 11:41 AM

## 2016-04-16 NOTE — Progress Notes (Signed)
UR chart review completed.  

## 2016-04-16 NOTE — Anesthesia Postprocedure Evaluation (Signed)
Anesthesia Post Note  Patient: Meredith [name]  Procedure(s) Performed: * No procedures listed *  Patient location during evaluation: Mother Baby Anesthesia Type: Epidural Level of consciousness: awake and alert Pain management: pain level controlled Vital Signs Assessment: post-procedure vital signs reviewed and stable Respiratory status: spontaneous breathing, nonlabored ventilation and respiratory function stable Cardiovascular status: stable Postop Assessment: no headache, no backache and epidural receding Anesthetic complications: no     Last Vitals:  Vitals:   04/16/16 0220 04/16/16 0345  BP: (!) 129/59 (!) 109/55  Pulse: 75 75  Resp: 16 18  Temp: 36.9 C 37.1 C    Last Pain:  Vitals:   04/16/16 0345  TempSrc: Oral  PainSc: 0-No pain   Pain Goal:                 Sherod Cisse

## 2016-04-17 DIAGNOSIS — O480 Post-term pregnancy: Secondary | ICD-10-CM

## 2016-04-17 DIAGNOSIS — Z3A41 41 weeks gestation of pregnancy: Secondary | ICD-10-CM

## 2016-04-17 DIAGNOSIS — O34211 Maternal care for low transverse scar from previous cesarean delivery: Secondary | ICD-10-CM

## 2016-04-17 MED ORDER — SENNOSIDES-DOCUSATE SODIUM 8.6-50 MG PO TABS: 2.0000 | ORAL_TABLET | Freq: Every day | ORAL | 0 refills | Status: DC

## 2016-04-17 MED ORDER — IBUPROFEN 600 MG PO TABS: 600.0000 mg | ORAL_TABLET | Freq: Four times a day (QID) | ORAL | 0 refills | Status: DC

## 2016-04-17 NOTE — Discharge Summary (Signed)
OB Discharge Summary     Patient Name: Meredith [name] DOB: Jul 20, 1990 MRN: [id number]  Date of admission: 04/15/2016 Delivering MD: Cam HaiSHAW, KIMBERLY D   Date of discharge: 04/17/2016  Admitting diagnosis: INDUCTION Intrauterine pregnancy: 275w1d     Secondary diagnosis:  Active Problems:   Post-dates pregnancy  Additional problems: none     Discharge diagnosis: Term Pregnancy Delivered and VBAC                                                                                                Post partum procedures:none  Augmentation: AROM and Pitocin  Complications: None  Hospital course:  Induction of Labor With Vaginal Delivery   25 y.o. yo [id number] at 6775w1d was admitted to the hospital 04/15/2016 for induction of labor.  Indication for induction: Postdates.  Patient had an uncomplicated labor course as follows: Membrane Rupture Time/Date: 6:25 PM ,04/15/2016   Intrapartum Procedures: Episiotomy: None [1]                                         Lacerations:  None [1]  Patient had delivery of a Viable infant.  Information for the patient's newborn:  Domenick BookbinderMosqueda, Girl Irving Burtonmily [[id number]][[id number]]  Delivery Method: VBAC, Spontaneous (Filed from Delivery Summary)   04/16/2016  Details of delivery can be found in separate delivery note.  Patient had a routine postpartum course. Patient is discharged home 04/17/16.   Physical exam Vitals:   04/16/16 0345 04/16/16 0730 04/16/16 1742 04/17/16 0542  BP: (!) 109/55 131/70 122/73 130/81  Pulse: 75 85 79 76  Resp: 18 16 20 18   Temp: 98.7 F (37.1 C) 98.5 F (36.9 C) 97.8 F (36.6 C) 98.3 F (36.8 C)  TempSrc: Oral Oral Oral Oral  SpO2:      Weight:      Height:       General: alert, cooperative and no distress Lochia: appropriate Uterine Fundus: firm Incision: N/A DVT Evaluation: No evidence of DVT seen on physical exam. Labs: Lab Results  Component Value Date   WBC 10.3 04/15/2016   HGB 9.7 (L) 04/15/2016   HCT 30.8 (L) 04/15/2016   MCV 73.3 (L) 04/15/2016   PLT 193 04/15/2016   CMP Latest Ref Rng & Units 07/18/2013  Glucose 70 - 99 mg/dL 94  BUN 6 - 23 mg/dL 7  Creatinine 1.300.50 - 8.651.10 mg/dL 7.840.57  Sodium 696137 - 295147 mEq/L 136(L)  Potassium 3.7 - 5.3 mEq/L 4.4  Chloride 96 - 112 mEq/L 102  CO2 19 - 32 mEq/L 21  Calcium 8.4 - 10.5 mg/dL 8.8  Total Protein 6.0 - 8.3 g/dL 6.9  Total Bilirubin 0.3 - 1.2 mg/dL <2.8(U<0.2(L)  Alkaline Phos 39 - 117 U/L 199(H)  AST 0 - 37 U/L 14  ALT 0 - 35 U/L 7    Discharge instruction: per After Visit Summary and "Baby and Me Booklet".  After visit meds:    Medication List    TAKE these medications   ferrous sulfate 160 (  50 Fe) MG Tbcr SR tablet Commonly known as:  SLOW IRON Take 1 tablet (160 mg total) by mouth daily.   ibuprofen 600 MG tablet Commonly known as:  ADVIL,MOTRIN Take 1 tablet (600 mg total) by mouth every 6 (six) hours.   prenatal multivitamin Tabs tablet Take 1 tablet by mouth daily at 12 noon.   senna-docusate 8.6-50 MG tablet Commonly known as:  Senokot-S Take 2 tablets by mouth at bedtime.       Diet: routine diet  Activity: Advance as tolerated. Pelvic rest for 6 weeks.   Outpatient follow up:6 weeks Follow up Appt:No future appointments. Follow up Visit:No Follow-up on file.  Postpartum contraception: IUD Mirena and Undecided  Newborn Data: Live born female  Birth Weight: 8 lb 11 oz (3940 g) APGAR: 7, 9  Baby Feeding: Bottle and Breast Disposition:home with mother   04/17/2016 Tillman SersAngela C Riccio, DO  OB FELLOW DISCHARGE ATTESTATION  I have seen and examined this patient and agree with above documentation in the resident's note.   Ernestina PennaNicholas Janette Harvie, MD 11:24 AM

## 2016-04-17 NOTE — Discharge Instructions (Signed)

## 2016-04-17 NOTE — Progress Notes (Signed)
POSTPARTUM PROGRESS NOTE  Post Partum Day 1 Subjective:  Meredith [name] is a 25 y.o. [id number] 114w1d s/p VBAC after IOL for post-dates.  No acute events overnight.  Pt denies problems with ambulating, voiding or po intake.  She denies nausea or vomiting.  Pain is well controlled.  She has had flatus. She has had bowel movement.  Lochia Minimal. Denies difficulties with breast feeding.    Objective: Blood pressure 130/81, pulse 76, temperature 98.3 F (36.8 C), temperature source Oral, resp. rate 18, height 5\' 3"  (1.6 m), weight 114.3 kg (252 lb), last menstrual period 08/28/2015, SpO2 100 %, unknown if currently breastfeeding.  Physical Exam:  General: alert, cooperative and no distress Lochia:normal flow Chest: no respiratory distress Heart:regular rate, distal pulses intact Abdomen: soft, nontender,  Uterine Fundus: firm, appropriately tender DVT Evaluation: No calf swelling or tenderness Extremities: No edema   Recent Labs  04/15/16 0845  HGB 9.7*  HCT 30.8*    Assessment/Plan:  ASSESSMENT: Meredith Duemily Germer is a 25 y.o. [id number] 404w1d s/p VBAC after IOL for post -dates. Patient is progressing nicely and is without any current pain.  She would like to be d/c'd today.    - Breastfeeding - D/C today - Unsure about contraception    LOS: 2 days   Jeannine Kittenathaniel Koutlas MS3 04/17/2016, 7:16 AM    OB FELLOW MEDICAL STUDENT NOTE ATTESTATION  I have seen and examined this patient. Note this is a Psychologist, occupationalmedical student note and as such does not necessarily reflect the patient's plan of care. Please see Ernestina Pennaicholas Mahoganie Basher MD's note for this date of service.    Ernestina Pennaicholas Turrell Severt 04/17/2016, 8:49 AM

## 2016-04-27 NOTE — Progress Notes (Signed)
10/27 NST reviewed and reactive

## 2016-06-11 ENCOUNTER — Ambulatory Visit (INDEPENDENT_AMBULATORY_CARE_PROVIDER_SITE_OTHER): Payer: Medicaid Other | Admitting: Certified Nurse Midwife

## 2016-06-11 ENCOUNTER — Encounter: Payer: Self-pay | Admitting: Certified Nurse Midwife

## 2016-06-11 DIAGNOSIS — Z30011 Encounter for initial prescription of contraceptive pills: Secondary | ICD-10-CM

## 2016-06-11 MED ORDER — NORETHIN-ETH ESTRAD-FE BIPHAS 1 MG-10 MCG / 10 MCG PO TABS: 1.0000 | ORAL_TABLET | Freq: Every day | ORAL | 4 refills | Status: DC

## 2016-06-11 NOTE — Progress Notes (Signed)
Post Partum Exam  Meredith [name] is a 25 y.o. [phone] female who presents for a postpartum visit. She is 7 weeks postpartum following a spontaneous vaginal delivery. I have fully reviewed the prenatal and intrapartum course. The delivery was at 41 gestational weeks.  Anesthesia: epidural. Postpartum course has been doing well. Baby's course has been doing well. Baby is feeding by both breast and bottle - Similac Advance. Bleeding no bleeding. Bowel function is normal. Bladder function is normal. Patient is not sexually active. Contraception method is none. Pt would like to start pill for contraception. Postpartum depression screening:neg, score 0.  The following portions of the patient's history were reviewed and updated as appropriate: allergies, current medications, past family history, past medical history, past social history, past surgical history and problem list.  Review of Systems Pertinent items noted in HPI and remainder of comprehensive ROS otherwise negative.    Objective:    BP 116/78 mmHg  Pulse 78  Resp 16  Ht 5\' 5"  (1.651 m)  Wt 211 lb (95.709 kg)  BMI 35.11 kg/m2  Breastfeeding? Yes  General:  alert, cooperative, no distress and moderately obese   Breasts:  inspection negative, no nipple discharge or bleeding, no masses or nodularity palpable  Lungs: clear to auscultation bilaterally  Heart:  regular rate and rhythm, S1, S2 normal, no murmur, click, rub or gallop  Abdomen: soft, non-tender; bowel sounds normal; no masses,  no organomegaly   Vulva:  normal  Vagina: normal vagina, no discharge, exudate, lesion, or erythema  Cervix:  no cervical motion tenderness  Corpus: normal size, contour, position, consistency, mobility, non-tender  Adnexa:  no mass, fullness, tenderness  Rectal Exam: Not performed.        Assessment:    Normal 7 week postpartum exam. Pap smear not done at today's visit.   Plan:   Pap smear up to date: Last pap smear 02/18/16: normal 1.  Contraception: abstinence 2. Staring on LoLo today 3. Follow up in: 3 months for birth control check or as needed.

## 2017-01-16 IMAGING — US US MFM OB LIMITED
1 series · 15 of 23 positions shown · non-contrast
Comparison: none

[Series 1: us mfm ob limited · 23 acquisitions, 15 frames shown]
[im 1/23]
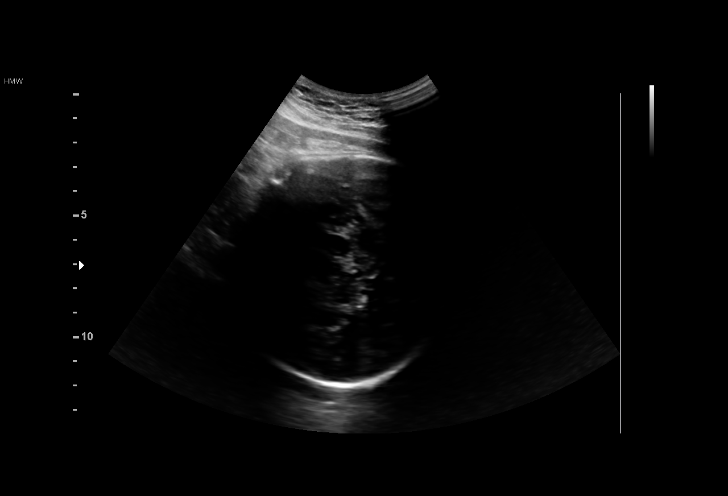
[im 3/23]
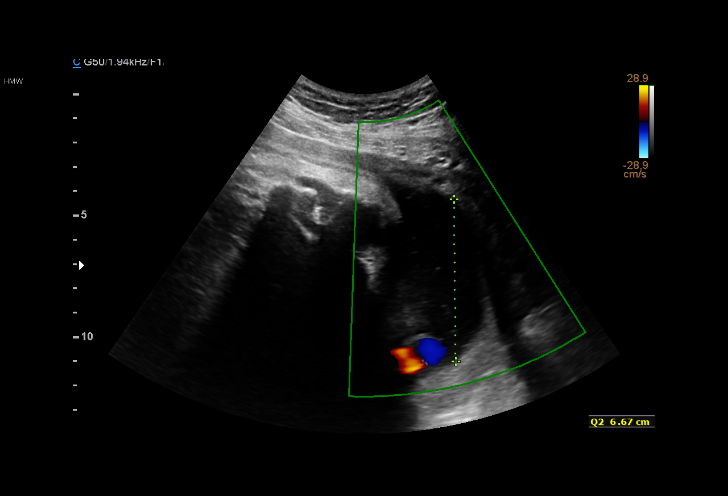
[im 4/23]
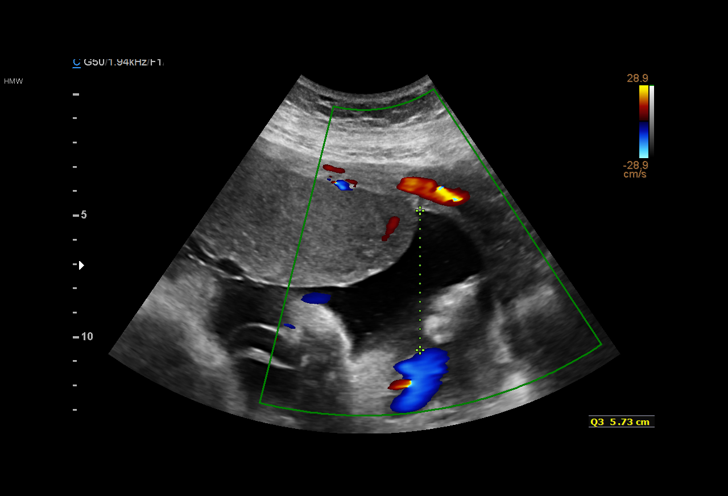
[im 6/23]
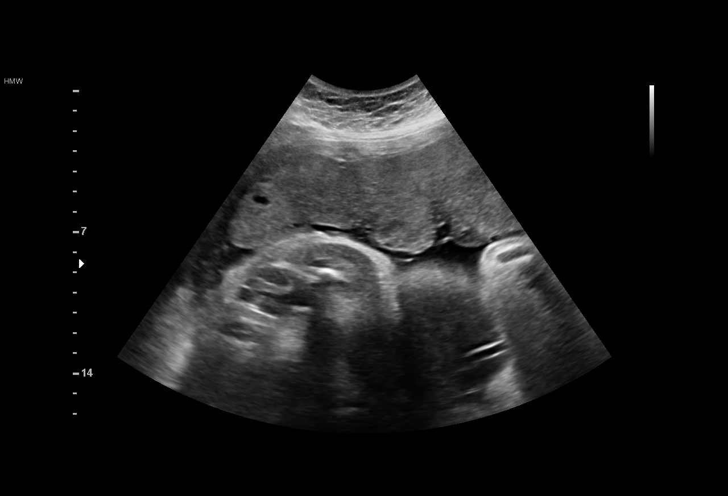
[im 7/23]
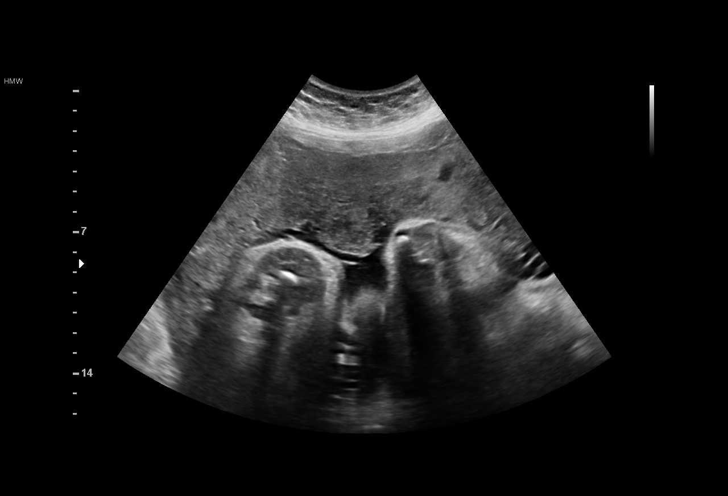
[im 9/23]
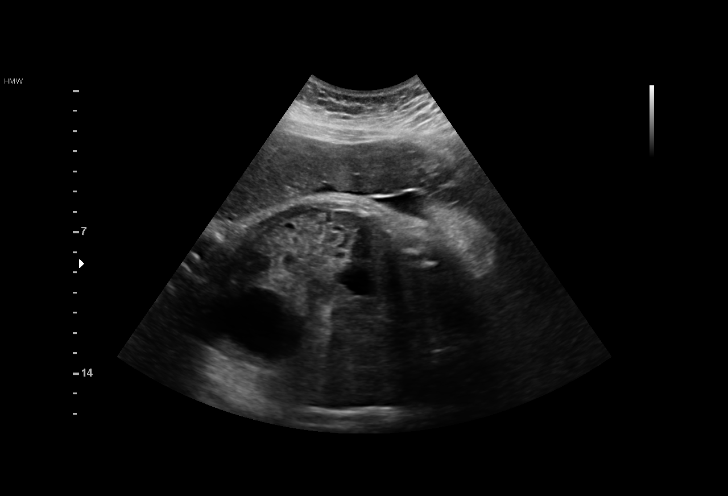
[im 10/23]
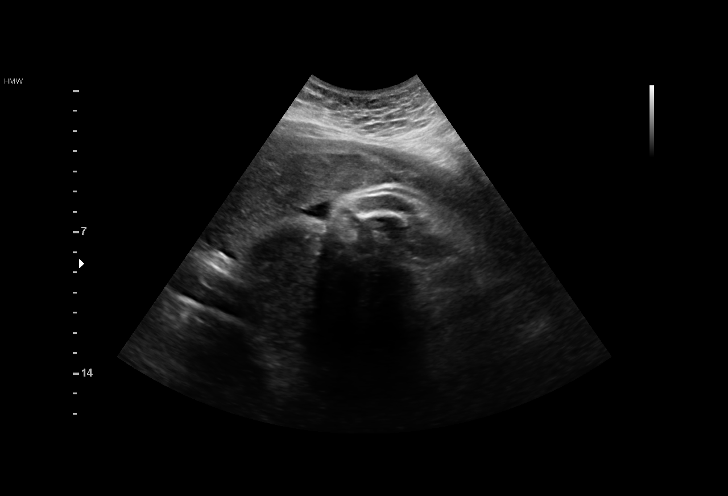
[im 12/23]
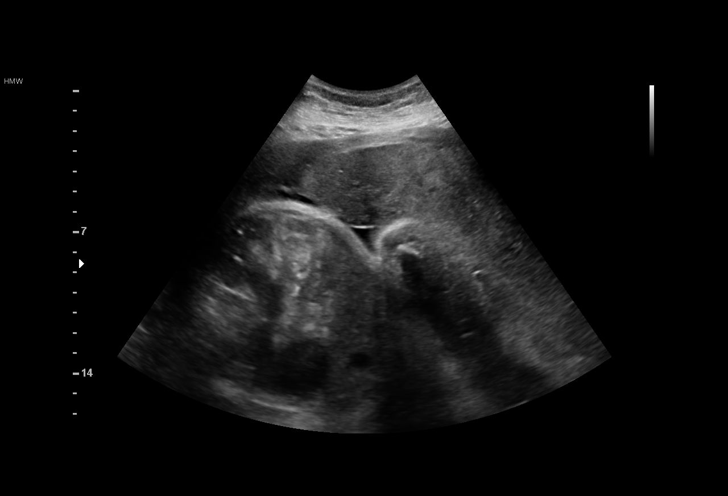
[im 14/23]
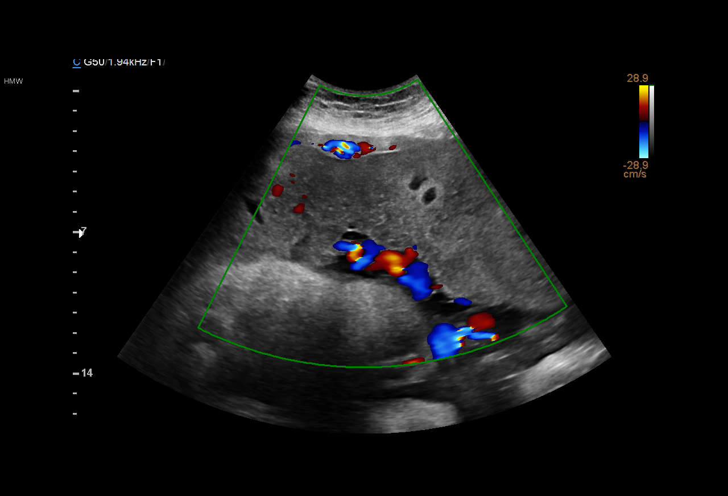
[im 15/23]
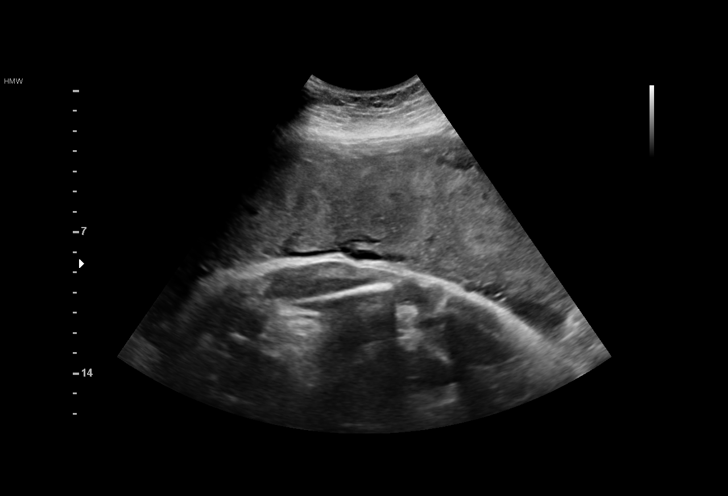
[im 17/23]
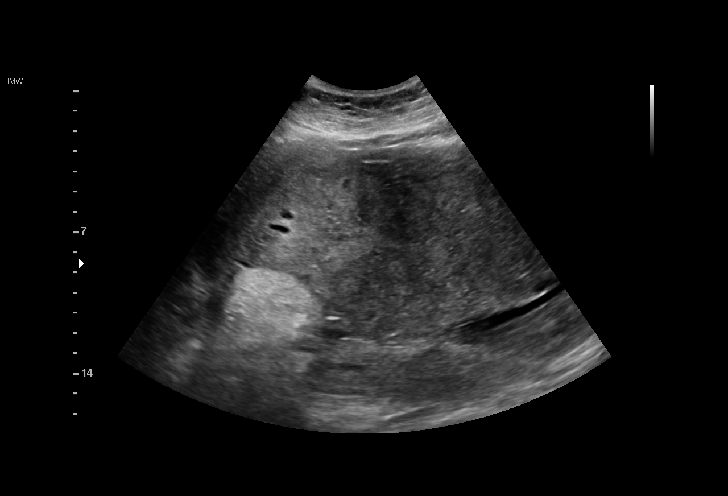
[im 18/23]
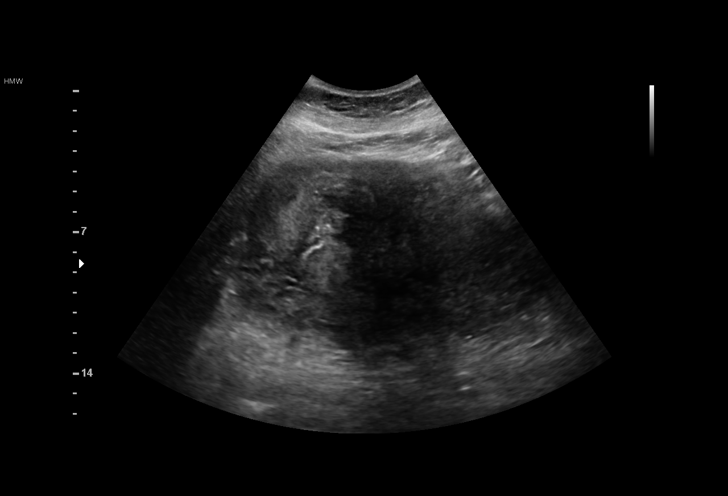
[im 20/23]
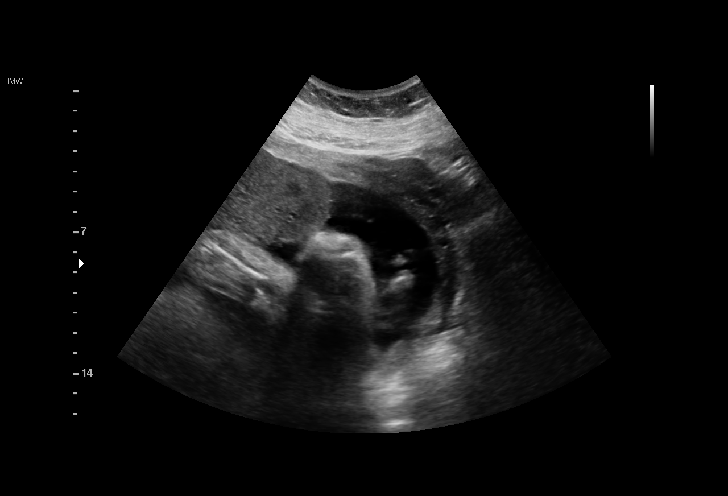
[im 21/23]
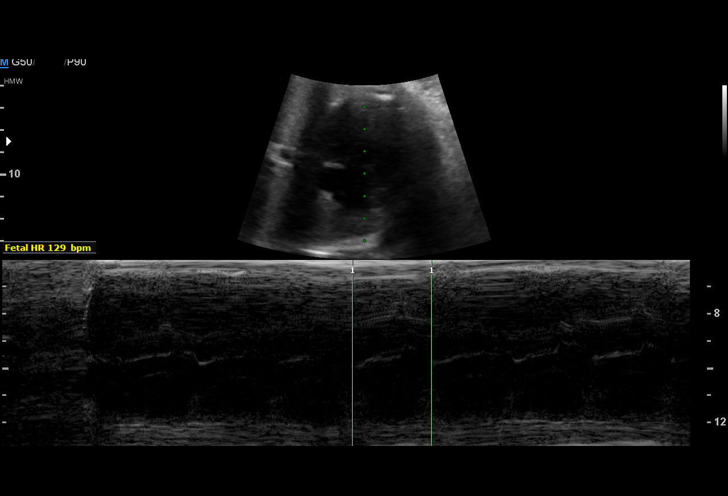
[im 23/23]
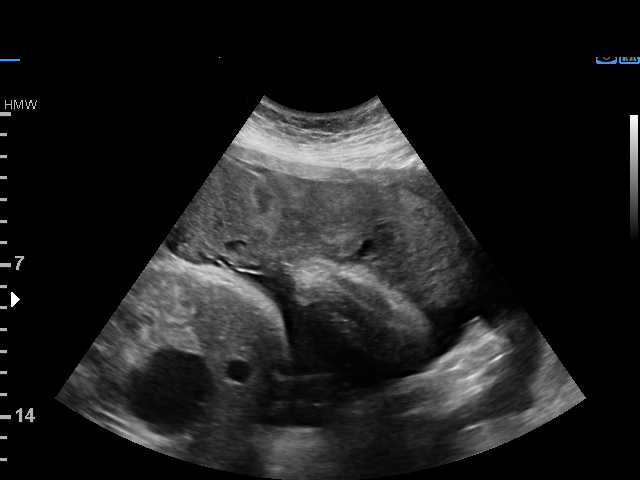

[15 of 23 positions shown; findings below may reference images not displayed]

OB/Gyn Clinic

1  RINNAH TIGER           [id number]      [id number]     [id number]
Indications

39 weeks gestation of pregnancy
Obesity complicating pregnancy, third
trimester
OB History

Gravidity:    5         Term:   2         SAB:   2
Living:       2
Fetal Evaluation

Num Of Fetuses:     1
Fetal Heart         129
Rate(bpm):
Cardiac Activity:   Observed
Presentation:       Cephalic
Placenta:           Anterior, above cervical os
P. Cord Insertion:  Visualized, central

Amniotic Fluid
AFI FV:      Subjectively within normal limits

AFI Sum(cm)     %Tile       Largest Pocket(cm)
21.95           92

RUQ(cm)       RLQ(cm)       LUQ(cm)        LLQ(cm)
5.64
Gestational Age

LMP:           31w 0d       Date:   08/28/15                 EDD:   06/03/16
Best:          39w 0d    Det. By:   U/S  (02/26/16)          EDD:   04/08/16
Cervix Uterus Adnexa

Cervix
Not visualized (advanced GA >64wks)

Uterus
No abnormality visualized.

Left Ovary
No adnexal mass visualized.

Right Ovary
No adnexal mass visualized.

Cul De Sac:   No free fluid seen.

Adnexa:       No abnormality visualized.
Impression

SIUP at 39+0 weeks (by 34 week US)
Cephalic presentation
Normal amniotic fluid volume

Recommendations

Follow-up as clinically indicated

## 2017-06-15 NOTE — L&D Delivery Note (Signed)
[facility] Note [facility] and began feeling a lot of vaginal pressure soon after. She pushed with a contraction or two and at 12:29 AM a non-viable child was delivered via Vaginal, Spontaneous.  APGAR: 0 ,0 ; weight: pending. Cord was approx the width of a piece of yarn- clamped and cut. No traction was placed. Pt given cytotec 800mcg buccally at 0056 to assist with delivery of placenta.  Placenta status spont @ 0111 , appears to be intact but there are two sections. FF with nl lochia.  Anesthesia: None  Episiotomy: None Lacerations: None Est. Blood Loss (mL): 301 (mostly clots prior to placenta delivery)  Mom to transfer to postpartum care in the same unit.  Baby to funeral home or pathology, decision per pt and husband.  Cam HaiSHAW, KIMBERLY CNM 02/26/2018, 1:26 AM

## 2017-09-24 ENCOUNTER — Inpatient Hospital Stay (HOSPITAL_COMMUNITY)
Admission: AD | Admit: 2017-09-24 | Discharge: 2017-09-25 | DRG: 779 | Disposition: A | Discharge status: Home / Self Care | Payer: Medicaid Other | Source: Ambulatory Visit | Attending: Obstetrics and Gynecology | Admitting: Obstetrics and Gynecology

## 2017-09-24 ENCOUNTER — Encounter (HOSPITAL_COMMUNITY): Payer: Self-pay | Admitting: *Deleted

## 2017-09-24 ENCOUNTER — Other Ambulatory Visit: Payer: Self-pay

## 2017-09-24 DIAGNOSIS — O039 Complete or unspecified spontaneous abortion without complication: Secondary | ICD-10-CM | POA: Diagnosis present

## 2017-09-24 DIAGNOSIS — Z87891 Personal history of nicotine dependence: Secondary | ICD-10-CM | POA: Diagnosis not present

## 2017-09-24 DIAGNOSIS — O021 Missed abortion: Secondary | ICD-10-CM | POA: Diagnosis present

## 2017-09-24 LAB — RAPID URINE DRUG SCREEN, HOSP PERFORMED
Amphetamines: NOT DETECTED
BARBITURATES: NOT DETECTED
BENZODIAZEPINES: NOT DETECTED
COCAINE: NOT DETECTED
Opiates: NOT DETECTED
Tetrahydrocannabinol: NOT DETECTED

## 2017-09-24 LAB — CBC
HEMATOCRIT: 38.2 % (ref 36.0–46.0)
HEMOGLOBIN: 12.7 g/dL (ref 12.0–15.0)
MCH: 27.1 pg (ref 26.0–34.0)
MCHC: 33.2 g/dL (ref 30.0–36.0)
MCV: 81.6 fL (ref 78.0–100.0)
Platelets: 260 10*3/uL (ref 150–400)
RBC: 4.68 MIL/uL (ref 3.87–5.11)
RDW: 14.6 % (ref 11.5–15.5)
WBC: 11.1 10*3/uL — ABNORMAL HIGH (ref 4.0–10.5)

## 2017-09-24 LAB — TYPE AND SCREEN
ABO/RH(D): O POS
Antibody Screen: NEGATIVE

## 2017-09-24 LAB — HEPATITIS B SURFACE ANTIGEN: Hepatitis B Surface Ag: NEGATIVE

## 2017-09-24 MED ORDER — SIMETHICONE 80 MG PO CHEW: 80.0000 mg | CHEWABLE_TABLET | ORAL | Status: DC | PRN

## 2017-09-24 MED ORDER — PRENATAL MULTIVITAMIN CH: 1.0000 | ORAL_TABLET | Freq: Every day | ORAL | Status: DC

## 2017-09-24 MED ORDER — TETANUS-DIPHTH-ACELL PERTUSSIS 5-2.5-18.5 LF-MCG/0.5 IM SUSP: 0.5000 mL | Freq: Once | INTRAMUSCULAR | Status: DC

## 2017-09-24 MED ORDER — DIPHENHYDRAMINE HCL 25 MG PO CAPS: 25.0000 mg | ORAL_CAPSULE | Freq: Four times a day (QID) | ORAL | Status: DC | PRN

## 2017-09-24 MED ORDER — ONDANSETRON HCL 4 MG/2ML IJ SOLN: 4.0000 mg | INTRAMUSCULAR | Status: DC | PRN

## 2017-09-24 MED ORDER — SENNOSIDES-DOCUSATE SODIUM 8.6-50 MG PO TABS
2.0000 | ORAL_TABLET | ORAL | Status: DC
  Administered 2017-09-25: 2 via ORAL
  Filled 2017-09-24: qty 2

## 2017-09-24 MED ORDER — COCONUT OIL OIL: 1.0000 "application " | TOPICAL_OIL | Status: DC | PRN

## 2017-09-24 MED ORDER — BENZOCAINE-MENTHOL 20-0.5 % EX AERO: 1.0000 "application " | INHALATION_SPRAY | CUTANEOUS | Status: DC | PRN

## 2017-09-24 MED ORDER — WITCH HAZEL-GLYCERIN EX PADS: 1.0000 "application " | MEDICATED_PAD | CUTANEOUS | Status: DC | PRN

## 2017-09-24 MED ORDER — ACETAMINOPHEN 325 MG PO TABS: 650.0000 mg | ORAL_TABLET | ORAL | Status: DC | PRN

## 2017-09-24 MED ORDER — ONDANSETRON HCL 4 MG PO TABS: 4.0000 mg | ORAL_TABLET | ORAL | Status: DC | PRN

## 2017-09-24 MED ORDER — DIBUCAINE 1 % RE OINT
1.0000 | TOPICAL_OINTMENT | RECTAL | Status: DC | PRN
Start: 2017-09-24 — End: 2017-09-25

## 2017-09-24 MED ORDER — IBUPROFEN 600 MG PO TABS
600.0000 mg | ORAL_TABLET | Freq: Four times a day (QID) | ORAL | Status: DC
  Administered 2017-09-24 – 2017-09-25 (×4): 600 mg via ORAL
  Filled 2017-09-24 (×4): qty 1

## 2017-09-24 NOTE — Progress Notes (Signed)
I offered support to pt and family.  Pt reports that she is really doing okay and did not state any specific concerns.  I gave her follow up resources for support including [facility] to help with her 626, 184 and 26 year old children.  Chaplain Dyanne CarrelKaty Tegh Franek, Bcc PAger, [phone] 3:24 PM   09/24/17 1500  Clinical Encounter Type  Visited With Patient and family together  Visit Type Spiritual support

## 2017-09-24 NOTE — MAU Note (Addendum)
Patient arrived via EMS.  Vaginal delivery of a fetus at 0855, at home.  Pt. Unable to tell if fetus was alive at birth, "I didn't look". "Baby came out in bag, placenta and all."  [facility] small amount of vaginal bleeding noted, fundus firm. IV started in left hand with 18 g needle. LR infusing, bolus.  Fetus in bio bag, pt. Does not want to look, but does want to know gender of fetus. Pt. Is comfortable with fetus in the room, per nurse question, "Are you OK with fetus in the room, pt. respond, "Yes".

## 2017-09-24 NOTE — H&P (Signed)
OBSTETRIC ADMISSION HISTORY AND PHYSICAL  Laqueta Duemily Dicker is a 27 y.o. female [phone] with IUP at unknown gestation presenting after delivery at home. She found out she was pregnant 2 months ago. She is unsure of her LMP. She started having abdominal pain around 7am then became more like contractions just prior to delivery around 9am. No LOF or VB. [facility].   Prenatal History/Complications: No PNC  Past Medical History: Past Medical History:  Diagnosis Date  . No pertinent past medical history     Past Surgical History: Past Surgical History:  Procedure Laterality Date  . CESAREAN SECTION  04/29/2011   Procedure: CESAREAN SECTION;  Surgeon: Roseanna RainbowLisa A Jackson-Moore, MD;  Location: [facility];  Service: Gynecology;  Laterality: N/A;  Primary Cesarian Section    Obstetrical History: OB History    Gravida  7   Para  3   Term  3   Preterm      AB  2   Living  3     SAB  2   TAB      Ectopic      Multiple  0   Live Births  3           Social History: Social History   Socioeconomic History  . Marital status: Single    Spouse name: Not on file  . Number of children: Not on file  . Years of education: Not on file  . Highest education level: Not on file  Occupational History  . Not on file  Social Needs  . Financial resource strain: Not on file  . Food insecurity:    Worry: Not on file    Inability: Not on file  . Transportation needs:    Medical: Not on file    Non-medical: Not on file  Tobacco Use  . Smoking status: Former Smoker    Packs/day: 0.25    Types: Cigarettes    Last attempt to quit: 03/05/2015    Years since quitting: 2.5  . Smokeless tobacco: Never Used  Substance and Sexual Activity  . Alcohol use: No    Alcohol/week: 0.0 oz  . Drug use: No  . Sexual activity: Yes    Birth control/protection: None  Lifestyle  . Physical activity:    Days per week: Not on file    Minutes per session: Not on  file  . Stress: Not on file  Relationships  . Social connections:    Talks on phone: Not on file    Gets together: Not on file    Attends religious service: Not on file    Active member of club or organization: Not on file    Attends meetings of clubs or organizations: Not on file    Relationship status: Not on file  Other Topics Concern  . Not on file  Social History Narrative   Marital status: married since 12/2015; from [place]; family from [place]      Children: 2 children (4 yo, 3yo)      Lives: with husband, 2 children      Employment: homemaker      Tobacco: none      Alcohol: socially/weekends      Drugs: none      Exercise: walking    Family History: Family History  Problem Relation Age of Onset  . Cancer Maternal Grandmother   . Diabetes Neg Hx   . Hyperlipidemia Neg Hx   . Hypertension  Neg Hx   . Kidney disease Neg Hx   . Stroke Neg Hx     Allergies: No Known Allergies  Medications Prior to Admission  Medication Sig Dispense Refill Last Dose  . Prenatal Vit-Fe Fumarate-FA (PRENATAL MULTIVITAMIN) TABS tablet Take 1 tablet by mouth daily at 12 noon.   09/23/2017 at Unknown time     Review of Systems   All systems reviewed and negative except as stated in HPI  Physical Exam: Blood pressure 97/66, pulse 89, temperature 98.3 F (36.8 C), temperature source Oral, resp. rate 18, SpO2 98 %, unknown if currently breastfeeding. General appearance: alert, cooperative and no distress Lungs: regular rate and effort Heart: regular rate  Abdomen: soft, non-tender Extremities: Homans sign is negative, no sign of DVT GU: lochia minimal, fundus firm, perineum intact (fetus appears approx 18 wks, membranes and placenta intact)  Prenatal labs: ABO, Rh:   Antibody:   Rubella:   RPR:    HBsAg:    HIV:    GBS:     Significant Maternal Lab Results:  No results found for this or any previous visit (from the past 24 hour(s)).  There are no active problems to display  for this patient.   Assessment: Trina Asch is a 27 y.o. [phone] at Unknown here for SVD/IUFD <20 wks No prenatal care Rh pos   Plan: Admit to postpartum on WU Routine postpartum orders UDS POC to pathology  Donette Larry, CNM  09/24/2017, 10:26 AM

## 2017-09-24 NOTE — MAU Note (Signed)
Late Entry:  Per EMS report, no signs of life upon arrival to patient's home.

## 2017-09-24 NOTE — MAU Note (Signed)
Pt's spouse and sister to the BS to see fetus, per mother's request.  Per mother's request, after visitation, fetus can be sent to pathology. Fetus and placenta sent to pathology.

## 2017-09-25 DIAGNOSIS — O039 Complete or unspecified spontaneous abortion without complication: Principal | ICD-10-CM

## 2017-09-25 LAB — RPR: RPR Ser Ql: NONREACTIVE

## 2017-09-25 LAB — HIV ANTIBODY (ROUTINE TESTING W REFLEX): HIV SCREEN 4TH GENERATION: NONREACTIVE

## 2017-09-25 MED ORDER — IBUPROFEN 600 MG PO TABS: 600.0000 mg | ORAL_TABLET | Freq: Four times a day (QID) | ORAL | 0 refills | Status: DC

## 2017-09-25 NOTE — Progress Notes (Signed)
Dr. Vergie LivingPickens on unit notified of of foul odor to pt lochia, pt afebrile.

## 2017-09-25 NOTE — Progress Notes (Signed)
OB Note  Patient sleeping. Will come by later to reassess.  Meredith [name], Jr MD Attending [facility] 09/25/2017 Time: [phone]

## 2017-09-25 NOTE — Plan of Care (Signed)
  Problem: Education: Goal: Knowledge of General Education information will improve Outcome: Progressing   Problem: Health Behavior/Discharge Planning: Goal: Ability to manage health-related needs will improve Outcome: Progressing   Problem: Clinical Measurements: Goal: Ability to maintain clinical measurements within normal limits will improve Outcome: Progressing Goal: Will remain free from infection Outcome: Progressing Goal: Diagnostic test results will improve Outcome: Progressing   Problem: Activity: Goal: Risk for activity intolerance will decrease Outcome: Progressing   Problem: Nutrition: Goal: Adequate nutrition will be maintained Outcome: Progressing   Problem: Coping: Goal: Level of anxiety will decrease Outcome: Progressing   Problem: Education: Goal: Verbalization of understanding the information provided will improve Outcome: Progressing   Problem: Coping: Goal: Level of anxiety will decrease Outcome: Progressing Goal: Ability to identify and utilize appropriate coping strategies will improve Outcome: Progressing Goal: Ability to identify and utilize available resources and services will improve Outcome: Progressing Goal: Will verbalize feelings Outcome: Progressing   Problem: Education: Goal: Knowledge of condition will improve Outcome: Progressing

## 2017-09-25 NOTE — Discharge Instructions (Signed)

## 2017-09-25 NOTE — Discharge Summary (Signed)
OB Discharge Summary     Patient Name: Meredith [name] DOB: 1991/06/02 MRN: [id number]  Date of admission: 09/24/2017 Delivering MD: Sab at home Date of discharge: 09/25/2017  Admitting diagnosis: 6 MONTHS DELIVERED AT HOME  Intrauterine pregnancy: Unknown     Secondary diagnosis:  Active Problems:   Spontaneous abortion in second trimester  Additional problems: none     Discharge diagnosis: Spontaneous second trimester abortion                                                                                                Post partum procedures:none  Augmentation: n/a  Complications: [facility] course:  Onset of Labor With Vaginal Delivery     27 y.o. yo [phone] at Unknown was admitted in after spontaneous miscarriage at home on 09/24/2017. Patient had an uncomplicated labor course as follows:  Patient had a delivery of a Non Viable infant. This patient has no babies on file.This patient has no babies on file.  She had no prenatal care to date and EGA was uncertain. Pateint had an uncomplicated postpartum course.  She is ambulating, tolerating a regular diet, passing flatus, and urinating well. Patient is discharged home in stable condition on 09/25/17.    Physical exam  Vitals:   09/24/17 1944 09/24/17 2358 09/25/17 0531 09/25/17 0752  BP: 123/75 120/65 119/70 (!) 104/58  Pulse: 78 88 84 62  Resp: 16 18 18 18   Temp: 99.4 F (37.4 C) 98.9 F (37.2 C) 98.6 F (37 C) 97.9 F (36.6 C)  TempSrc: Oral Oral Oral Oral  SpO2: 98% 100% 100% 100%  Weight:      Height:       General: alert, cooperative and no distress Lochia: appropriate Uterine Fundus: firm Incision: N/A DVT Evaluation: No evidence of DVT seen on physical exam. Labs: Lab Results  Component Value Date   WBC 11.1 (H) 09/24/2017   HGB 12.7 09/24/2017   HCT 38.2 09/24/2017   MCV 81.6 09/24/2017   PLT 260 09/24/2017   CMP Latest Ref Rng & Units 07/18/2013  Glucose 70 - 99 mg/dL 94  BUN 6 - 23  mg/dL 7  Creatinine 1.47 - 8.29 mg/dL 5.62  Sodium 130 - 865 mEq/L 136(L)  Potassium 3.7 - 5.3 mEq/L 4.4  Chloride 96 - 112 mEq/L 102  CO2 19 - 32 mEq/L 21  Calcium 8.4 - 10.5 mg/dL 8.8  Total Protein 6.0 - 8.3 g/dL 6.9  Total Bilirubin 0.3 - 1.2 mg/dL <7.8(I)  Alkaline Phos 39 - 117 U/L 199(H)  AST 0 - 37 U/L 14  ALT 0 - 35 U/L 7    Discharge instruction: per After Visit Summary and "Baby and Me Booklet".  After visit meds:  Allergies as of 09/25/2017   No Known Allergies     Medication List    TAKE these medications   ibuprofen 600 MG tablet Commonly known as:  ADVIL,MOTRIN Take 1 tablet (600 mg total) by mouth every 6 (six) hours.   prenatal multivitamin Tabs tablet Take 1 tablet by mouth daily at 12 noon.  Diet: routine diet  Activity: Advance as tolerated. Pelvic rest for 6 weeks.   Outpatient follow up:2 weeks Follow up Appt:No future appointments. Follow up Visit:No follow-ups on file.  Postpartum contraception: Undecided  Newborn Data: This patient has no babies on file. Disposition:morgue   09/25/2017 Scheryl DarterJames Azalyn Sliwa, MD

## 2017-09-25 NOTE — Progress Notes (Signed)
Pt c/o of saline lock hurting her, flushed fine.  Wanting it removed, informed pt if I take it out and she needs and IV will need to put it back in.  Pt states she wants it out and if she needs an IV we can put it back in.

## 2017-09-25 NOTE — Plan of Care (Signed)
  Problem: Education: Goal: Knowledge of General Education information will improve Outcome: Progressing   Problem: Health Behavior/Discharge Planning: Goal: Ability to manage health-related needs will improve Outcome: Progressing   Problem: Clinical Measurements: Goal: Ability to maintain clinical measurements within normal limits will improve Outcome: Progressing Goal: Will remain free from infection Outcome: Progressing Goal: Diagnostic test results will improve Outcome: Progressing   Problem: Activity: Goal: Risk for activity intolerance will decrease Outcome: Progressing   Problem: Nutrition: Goal: Adequate nutrition will be maintained Outcome: Progressing   Problem: Coping: Goal: Level of anxiety will decrease Outcome: Progressing   Problem: Coping: Goal: Level of anxiety will decrease Outcome: Progressing Goal: Ability to identify and utilize appropriate coping strategies will improve Outcome: Progressing Goal: Ability to identify and utilize available resources and services will improve Outcome: Progressing Goal: Will verbalize feelings Outcome: Progressing   Problem: Education: Goal: Knowledge of condition will improve Outcome: Progressing

## 2017-09-25 NOTE — Progress Notes (Signed)
All discharge teaching completed with the patient. All printed discharge instructions given and explained to the patient. Patient verbalizes an understanding of all instructions. Denies any questions or concerns at this time.

## 2017-10-21 ENCOUNTER — Ambulatory Visit (INDEPENDENT_AMBULATORY_CARE_PROVIDER_SITE_OTHER): Payer: Medicaid Other | Admitting: Certified Nurse Midwife

## 2017-10-21 ENCOUNTER — Encounter: Payer: Self-pay | Admitting: Certified Nurse Midwife

## 2017-10-21 VITALS — BP 128/76 | HR 88 | Wt 247.0 lb

## 2017-10-21 DIAGNOSIS — Z3009 Encounter for other general counseling and advice on contraception: Secondary | ICD-10-CM | POA: Diagnosis not present

## 2017-10-21 DIAGNOSIS — Z8759 Personal history of other complications of pregnancy, childbirth and the puerperium: Secondary | ICD-10-CM

## 2017-10-21 DIAGNOSIS — Z3002 Counseling and instruction in natural family planning to avoid pregnancy: Secondary | ICD-10-CM

## 2017-10-21 NOTE — Progress Notes (Signed)
Presents for FU from SAB still birth @ 6 months.  Wants to discuss long term BC options.  UPT today is Negative.

## 2017-10-21 NOTE — Progress Notes (Signed)
Subjective:    Meredith [name] is a 27 y.o. female who presents for contraception counseling. The patient has no complaints today. The patient is not currently sexually active. Pertinent past medical history: none.  Had recent miscarriage that started at home of roughly <[redacted] weeks gestation; infant was cremated.  Was seen at [facility] and released on 09/25/17.    Reviewed all forms of birth control options available including abstinence; fertility period awareness methods; over the counter/barrier methods; hormonal contraceptive medication including pill, patch, ring, injection,contraceptive implant; hormonal and nonhormonal IUDs; permanent sterilization options including vasectomy and the various tubal sterilization modalities. Risks and benefits reviewed.  Questions were answered.  Information was given to patient to review.   The information documented in the HPI was reviewed and verified.  Menstrual History: OB History    Gravida  7   Para  3   Term  3   Preterm      AB  3   Living  3     SAB  3   TAB      Ectopic      Multiple  0   Live Births  3            No LMP recorded.   Patient Active Problem List   Diagnosis Date Noted  . Spontaneous abortion in second trimester 09/24/2017   Past Medical History:  Diagnosis Date  . No pertinent past medical history     Past Surgical History:  Procedure Laterality Date  . CESAREAN SECTION  04/29/2011   Procedure: CESAREAN SECTION;  Surgeon: Roseanna Rainbow, MD;  Location: [facility];  Service: Gynecology;  Laterality: N/A;  Primary Cesarian Section     Current Outpatient Medications:  .  ibuprofen (ADVIL,MOTRIN) 600 MG tablet, Take 1 tablet (600 mg total) by mouth every 6 (six) hours. (Patient not taking: Reported on 10/21/2017), Disp: 30 tablet, Rfl: 0 .  Prenatal Vit-Fe Fumarate-FA (PRENATAL MULTIVITAMIN) TABS tablet, Take 1 tablet by mouth daily at 12 noon., Disp: , Rfl:  No Known Allergies  Social History   Tobacco Use   . Smoking status: Former Smoker    Packs/day: 0.25    Types: Cigarettes    Last attempt to quit: 03/05/2015    Years since quitting: 2.6  . Smokeless tobacco: Never Used  Substance Use Topics  . Alcohol use: No    Alcohol/week: 0.0 oz    Family History  Problem Relation Age of Onset  . Cancer Maternal Grandmother   . Diabetes Neg Hx   . Hyperlipidemia Neg Hx   . Hypertension Neg Hx   . Kidney disease Neg Hx   . Stroke Neg Hx        Review of Systems Constitutional: negative for weight loss Genitourinary:negative for abnormal menstrual periods and vaginal discharge   Objective:   BP 128/76   Pulse 88   Wt 247 lb (112 kg)   Breastfeeding? No   BMI 43.75 kg/m    General:   alert  Skin:   no rash or abnormalities  Lungs:   clear to auscultation bilaterally  Heart:   regular rate and rhythm, S1, S2 normal, no murmur, click, rub or gallop  Breasts:   deferred  Abdomen:  normal findings: no organomegaly, soft, non-tender and no hernia3 obese  Pelvis:  deferred   Lab Review Urine pregnancy test Labs reviewed yes Radiologic studies reviewed no  50% of 15 min visit spent on counseling and coordination of care.  Assessment:    27 y.o., starting IUD, no contraindications.   Plan:    All questions answered.  Follow up in 2 weeks for Mirena IUD insertion or with next period.

## 2017-10-21 NOTE — Patient Instructions (Signed)

## 2017-10-29 ENCOUNTER — Encounter: Payer: Self-pay | Admitting: Family Medicine

## 2017-11-03 ENCOUNTER — Encounter: Payer: Self-pay | Admitting: Family Medicine

## 2017-11-05 ENCOUNTER — Ambulatory Visit: Payer: Medicaid Other | Admitting: Certified Nurse Midwife

## 2017-11-12 ENCOUNTER — Ambulatory Visit: Payer: Medicaid Other | Admitting: Certified Nurse Midwife

## 2017-11-16 ENCOUNTER — Telehealth: Payer: Self-pay | Admitting: *Deleted

## 2017-11-16 NOTE — Telephone Encounter (Signed)
Left vmail for patient to callback and reschedule missed IUD Insertion appt. On 11/12/2017.Marland Kitchen..Marland Kitchen

## 2018-02-24 ENCOUNTER — Ambulatory Visit (HOSPITAL_BASED_OUTPATIENT_CLINIC_OR_DEPARTMENT_OTHER)
Admission: RE | Admit: 2018-02-24 | Discharge: 2018-02-24 | Disposition: A | Discharge status: Home / Self Care | Payer: Medicaid Other | Source: Ambulatory Visit | Attending: Obstetrics and Gynecology | Admitting: Obstetrics and Gynecology

## 2018-02-24 ENCOUNTER — Inpatient Hospital Stay (EMERGENCY_DEPARTMENT_HOSPITAL)
Admission: AD | Admit: 2018-02-24 | Discharge: 2018-02-24 | Disposition: A | Discharge status: Home / Self Care | Payer: Medicaid Other | Source: Ambulatory Visit | Attending: Obstetrics & Gynecology | Admitting: Obstetrics & Gynecology

## 2018-02-24 ENCOUNTER — Inpatient Hospital Stay (HOSPITAL_COMMUNITY): Payer: Medicaid Other

## 2018-02-24 ENCOUNTER — Inpatient Hospital Stay (HOSPITAL_COMMUNITY)
Admission: AD | Admit: 2018-02-24 | Discharge: 2018-02-24 | DRG: 779 | Discharge status: Against Medical Advice | Payer: Medicaid Other | Attending: Obstetrics and Gynecology | Admitting: Obstetrics and Gynecology

## 2018-02-24 ENCOUNTER — Encounter (HOSPITAL_COMMUNITY): Payer: Self-pay | Admitting: *Deleted

## 2018-02-24 ENCOUNTER — Other Ambulatory Visit: Payer: Self-pay

## 2018-02-24 DIAGNOSIS — Z3A13 13 weeks gestation of pregnancy: Secondary | ICD-10-CM | POA: Diagnosis not present

## 2018-02-24 DIAGNOSIS — O021 Missed abortion: Secondary | ICD-10-CM | POA: Diagnosis present

## 2018-02-24 DIAGNOSIS — Z3A15 15 weeks gestation of pregnancy: Secondary | ICD-10-CM

## 2018-02-24 DIAGNOSIS — O4692 Antepartum hemorrhage, unspecified, second trimester: Secondary | ICD-10-CM

## 2018-02-24 DIAGNOSIS — O034 Incomplete spontaneous abortion without complication: Secondary | ICD-10-CM | POA: Diagnosis not present

## 2018-02-24 DIAGNOSIS — Z87891 Personal history of nicotine dependence: Secondary | ICD-10-CM

## 2018-02-24 DIAGNOSIS — Z789 Other specified health status: Secondary | ICD-10-CM

## 2018-02-24 DIAGNOSIS — O209 Hemorrhage in early pregnancy, unspecified: Secondary | ICD-10-CM

## 2018-02-24 HISTORY — DX: Other specified health status: Z78.9

## 2018-02-24 LAB — URINALYSIS, ROUTINE W REFLEX MICROSCOPIC
Bilirubin Urine: NEGATIVE
Glucose, UA: NEGATIVE mg/dL
KETONES UR: NEGATIVE mg/dL
Nitrite: NEGATIVE
PH: 5 (ref 5.0–8.0)
Protein, ur: NEGATIVE mg/dL
Specific Gravity, Urine: 1.024 (ref 1.005–1.030)

## 2018-02-24 LAB — CBC WITH DIFFERENTIAL/PLATELET
BASOS PCT: 0 %
Basophils Absolute: 0 10*3/uL (ref 0.0–0.1)
EOS ABS: 0.1 10*3/uL (ref 0.0–0.7)
Eosinophils Relative: 1 %
HCT: 37.9 % (ref 36.0–46.0)
HEMOGLOBIN: 12.3 g/dL (ref 12.0–15.0)
LYMPHS ABS: 2.5 10*3/uL (ref 0.7–4.0)
Lymphocytes Relative: 27 %
MCH: 26.1 pg (ref 26.0–34.0)
MCHC: 32.5 g/dL (ref 30.0–36.0)
MCV: 80.5 fL (ref 78.0–100.0)
Monocytes Absolute: 0.2 10*3/uL (ref 0.1–1.0)
Monocytes Relative: 2 %
NEUTROS ABS: 6.3 10*3/uL (ref 1.7–7.7)
NEUTROS PCT: 70 %
Platelets: 252 10*3/uL (ref 150–400)
RBC: 4.71 MIL/uL (ref 3.87–5.11)
RDW: 14.6 % (ref 11.5–15.5)
WBC: 9.2 10*3/uL (ref 4.0–10.5)

## 2018-02-24 LAB — HCG, QUANTITATIVE, PREGNANCY: HCG, BETA CHAIN, QUANT, S: 297 m[IU]/mL — AB (ref <5)

## 2018-02-24 LAB — WET PREP, GENITAL
CLUE CELLS WET PREP: NONE SEEN
Sperm: NONE SEEN
TRICH WET PREP: NONE SEEN
YEAST WET PREP: NONE SEEN

## 2018-02-24 LAB — COMPREHENSIVE METABOLIC PANEL
ALBUMIN: 3.6 g/dL (ref 3.5–5.0)
ALK PHOS: 73 U/L (ref 38–126)
ALT: 14 U/L (ref 0–44)
AST: 16 U/L (ref 15–41)
Anion gap: 10 (ref 5–15)
BUN: 7 mg/dL (ref 6–20)
CO2: 20 mmol/L — AB (ref 22–32)
Calcium: 8.9 mg/dL (ref 8.9–10.3)
Chloride: 105 mmol/L (ref 98–111)
Creatinine, Ser: 0.45 mg/dL (ref 0.44–1.00)
GFR calc Af Amer: >60 mL/min (ref >60)
GFR calc non Af Amer: >60 mL/min (ref >60)
GLUCOSE: 86 mg/dL (ref 70–99)
Potassium: 4 mmol/L (ref 3.5–5.1)
SODIUM: 135 mmol/L (ref 135–145)
Total Bilirubin: 0.9 mg/dL (ref 0.3–1.2)
Total Protein: 7.7 g/dL (ref 6.5–8.1)

## 2018-02-24 LAB — POCT PREGNANCY, URINE: Preg Test, Ur: POSITIVE — AB

## 2018-02-24 MED ORDER — MISOPROSTOL 200 MCG PO TABS: 400.0000 ug | ORAL_TABLET | ORAL | Status: DC

## 2018-02-24 MED ORDER — MISOPROSTOL 200 MCG PO TABS
800.0000 ug | ORAL_TABLET | Freq: Once | ORAL | Status: DC
  Filled 2018-02-24: qty 4

## 2018-02-24 MED ORDER — ACETAMINOPHEN 325 MG PO TABS
650.0000 mg | ORAL_TABLET | Freq: Four times a day (QID) | ORAL | Status: DC | PRN
  Administered 2018-02-24: 650 mg via ORAL
  Filled 2018-02-24: qty 2

## 2018-02-24 MED ORDER — ZOLPIDEM TARTRATE 5 MG PO TABS: 5.0000 mg | ORAL_TABLET | Freq: Every evening | ORAL | Status: DC | PRN

## 2018-02-24 MED ORDER — LACTATED RINGERS IV SOLN: INTRAVENOUS | Status: DC

## 2018-02-24 MED ORDER — FENTANYL CITRATE (PF) 100 MCG/2ML IJ SOLN: 50.0000 ug | INTRAMUSCULAR | Status: DC | PRN

## 2018-02-24 MED ORDER — ONDANSETRON HCL 4 MG/2ML IJ SOLN: 4.0000 mg | Freq: Four times a day (QID) | INTRAMUSCULAR | Status: DC | PRN

## 2018-02-24 NOTE — Progress Notes (Signed)
Pt  called RN and requested to leave  AMA. Rn notified MD . Spanish Interpreter came to speak to patient and the husband. Pt walked out with husband.

## 2018-02-24 NOTE — MAU Note (Signed)
Yesterday noted blood when she wiped after urination. Seeing it with urination today.  These are the same symptoms she had in April when she miscarried. Little bit of back pain. Other then preg confirmation, pt has not been seen, is waiting on medicaid.

## 2018-02-24 NOTE — H&P (Signed)
Meredith [name] is a 27 y.o. female presenting for IOL d/t to FDIU at 15 weeks. Pt presented earlier to MAU with c/o of some vaginal spotting. FDIU at 15 weeks confirmed. Tx options were reviewed with the pt . She elected to proceed with Cytotec IOL.  OB History    Gravida  8   Para  3   Term  3   Preterm      AB  3   Living  3     SAB  3   TAB      Ectopic      Multiple  0   Live Births  3        Obstetric Comments  5 month loss       Past Medical History:  Diagnosis Date  . Medical history non-contributory   . No pertinent past medical history    Past Surgical History:  Procedure Laterality Date  . CESAREAN SECTION  04/29/2011   Procedure: CESAREAN SECTION;  Surgeon: Roseanna RainbowLisa A Jackson-Moore, MD;  Location: [facility];  Service: Gynecology;  Laterality: N/A;  Primary Cesarian Section   Family History: family history includes Cancer in her maternal grandmother. Social History:  reports that she quit smoking about 2 years ago. Her smoking use included cigarettes. She smoked 0.25 packs per day. She has never used smokeless tobacco. She reports that she does not drink alcohol or use drugs.      Review of Systems  Constitutional: Negative.   Respiratory: Negative.   Cardiovascular: Negative.   Gastrointestinal: Negative.   Genitourinary: Negative.    History   Last menstrual period 11/22/2017, not currently breastfeeding. Exam Physical Exam  Constitutional: She appears well-developed and well-nourished.  Cardiovascular: Normal heart sounds.  Respiratory: Effort normal and breath sounds normal.  GI: Soft. Bowel sounds are normal.  Genitourinary:  Genitourinary Comments: As per recent MAU visit today    Prenatal labs: ABO, Rh: --/--/O POS (04/12 1000) Antibody: NEG (04/12 1000) Rubella:   RPR: Non Reactive (04/12 1000)  HBsAg: Negative (04/12 1000)  HIV: Non Reactive (04/12 1000)  GBS:      Repeat U/S at bedside per pt request, no cardiac activity  confirmed. FOB present for U/S  FOB present for U/S. Assessment/Plan: FDIU at 15 weeks Language Barrier   Admission for Cytotec IOL.  Hermina StaggersMichael L Burkley Dech 02/24/2018, 9:14 PM

## 2018-02-24 NOTE — Discharge Instructions (Signed)

## 2018-02-24 NOTE — MAU Provider Note (Signed)
History     CSN: [id number]  Arrival date and time: 02/24/18 1030   First Provider Initiated Contact with Patient 02/24/18 1123      Chief Complaint  Patient presents with  . Back Pain  . Vaginal Bleeding   HPI   Ms.Meredith [name] is a 27 y.o. female with a history of demise @ [redacted] weeks gestation [id number] @ 42w3dby uncertain LMP, here with vaginal bleeding. This is a new problem. The bleeding started yesterday. The bleeding is very light. She notices it on her bad as well as when she wipes. The bleeding comes and goes. Also having some lower back pain. No abdominal pain.   OB History    Gravida  8   Para  3   Term  3   Preterm      AB  3   Living  3     SAB  3   TAB      Ectopic      Multiple  0   Live Births  3        Obstetric Comments  5 month loss        Past Medical History:  Diagnosis Date  . Medical history non-contributory   . No pertinent past medical history     Past Surgical History:  Procedure Laterality Date  . CESAREAN SECTION  04/29/2011   Procedure: CESAREAN SECTION;  Surgeon: LAgnes Lawrence MD;  [facility];  Service: Gynecology;  Laterality: N/A;  Primary Cesarian Section    Family History  Problem Relation Age of Onset  . Cancer Maternal Grandmother   . Diabetes Neg Hx   . Hyperlipidemia Neg Hx   . Hypertension Neg Hx   . Kidney disease Neg Hx   . Stroke Neg Hx     Social History   Tobacco Use  . Smoking status: Former Smoker    Packs/day: 0.25    Types: Cigarettes    Last attempt to quit: 03/05/2015    Years since quitting: 2.9  . Smokeless tobacco: Never Used  Substance Use Topics  . Alcohol use: No  . Drug use: No    Allergies: No Known Allergies  Medications Prior to Admission  Medication Sig Dispense Refill Last Dose  . ibuprofen (ADVIL,MOTRIN) 600 MG tablet Take 1 tablet (600 mg total) by mouth every 6 (six) hours. (Patient not taking: Reported on 10/21/2017) 30 tablet 0 Not Taking  . Prenatal  Vit-Fe Fumarate-FA (PRENATAL MULTIVITAMIN) TABS tablet Take 1 tablet by mouth daily at 12 noon.   Not Taking   Results for orders placed or performed during the hospital encounter of 02/24/18 (from the past 48 hour(s))  Pregnancy, urine POC     Status: Abnormal   Collection Time: 02/24/18 10:56 AM  Result Value Ref Range   Preg Test, Ur POSITIVE (A) NEGATIVE    Comment:        THE SENSITIVITY OF THIS METHODOLOGY IS >24 mIU/mL   Urinalysis, Routine w reflex microscopic     Status: Abnormal   Collection Time: 02/24/18 10:57 AM  Result Value Ref Range   Color, Urine YELLOW YELLOW   APPearance HAZY (A) CLEAR   Specific Gravity, Urine 1.024 1.005 - 1.030   pH 5.0 5.0 - 8.0   Glucose, UA NEGATIVE NEGATIVE mg/dL   Hgb urine dipstick LARGE (A) NEGATIVE   Bilirubin Urine NEGATIVE NEGATIVE   Ketones, ur NEGATIVE NEGATIVE mg/dL   Protein, ur NEGATIVE NEGATIVE mg/dL   Nitrite  NEGATIVE NEGATIVE   Leukocytes, UA TRACE (A) NEGATIVE   RBC / HPF 0-5 0 - 5 RBC/hpf   WBC, UA 11-20 0 - 5 WBC/hpf   Bacteria, UA RARE (A) NONE SEEN   Squamous Epithelial / LPF 11-20 0 - 5   Mucus PRESENT     Comment: Performed at [facility], [address], [place], [place] [address]  Wet prep, genital     Status: Abnormal   Collection Time: 02/24/18 11:34 AM  Result Value Ref Range   Yeast Wet Prep HPF POC NONE SEEN NONE SEEN   Trich, Wet Prep NONE SEEN NONE SEEN   Clue Cells Wet Prep HPF POC NONE SEEN NONE SEEN   WBC, Wet Prep HPF POC MANY (A) NONE SEEN    Comment: MANY BACTERIA SEEN   Sperm NONE SEEN     Comment: Performed at [facility], [address] [place], [place] [address]  CBC with Differential/Platelet     Status: None   Collection Time: 02/24/18 11:52 AM  Result Value Ref Range   WBC 9.2 4.0 - 10.5 K/uL   RBC 4.71 3.87 - 5.11 MIL/uL   Hemoglobin 12.3 12.0 - 15.0 g/dL   HCT 37.9 36.0 - 46.0 %   MCV 80.5 78.0 - 100.0 fL   MCH 26.1 26.0 - 34.0 pg   MCHC 32.5 30.0 - 36.0 g/dL   RDW  14.6 11.5 - 15.5 %   Platelets 252 150 - 400 K/uL   Neutrophils Relative % 70 %   Neutro Abs 6.3 1.7 - 7.7 K/uL   Lymphocytes Relative 27 %   Lymphs Abs 2.5 0.7 - 4.0 K/uL   Monocytes Relative 2 %   Monocytes Absolute 0.2 0.1 - 1.0 K/uL   Eosinophils Relative 1 %   Eosinophils Absolute 0.1 0.0 - 0.7 K/uL   Basophils Relative 0 %   Basophils Absolute 0.0 0.0 - 0.1 K/uL    Comment: Performed at [facility], [address] [place], [place] [address]  Comprehensive metabolic panel     Status: Abnormal   Collection Time: 02/24/18 11:52 AM  Result Value Ref Range   Sodium 135 135 - 145 mmol/L   Potassium 4.0 3.5 - 5.1 mmol/L   Chloride 105 98 - 111 mmol/L   CO2 20 (L) 22 - 32 mmol/L   Glucose, Bld 86 70 - 99 mg/dL   BUN 7 6 - 20 mg/dL   Creatinine, Ser 0.45 0.44 - 1.00 mg/dL   Calcium 8.9 8.9 - 10.3 mg/dL   Total Protein 7.7 6.5 - 8.1 g/dL   Albumin 3.6 3.5 - 5.0 g/dL   AST 16 15 - 41 U/L   ALT 14 0 - 44 U/L   Alkaline Phosphatase 73 38 - 126 U/L   Total Bilirubin 0.9 0.3 - 1.2 mg/dL   GFR calc non Af Amer >60 >60 mL/min   GFR calc Af Amer >60 >60 mL/min    Comment: (NOTE) The eGFR has been calculated using the CKD EPI equation. This calculation has not been validated in all clinical situations. eGFR's persistently <60 mL/min signify possible Chronic Kidney Disease.    Anion gap 10 5 - 15    Comment: Performed at [facility], [address] [place],  [address]  hCG, quantitative, pregnancy     Status: Abnormal   Collection Time: 02/24/18 11:52 AM  Result Value Ref Range   hCG, Beta Chain, Quant, S 297 (H) <5 mIU/mL    Comment:  GEST. AGE      CONC.  (mIU/mL)   <=1 WEEK        5 - 50     2 WEEKS       50 - 500     3 WEEKS       100 - 10,000     4 WEEKS     1,000 - 30,000     5 WEEKS     3,500 - 115,000   6-8 WEEKS     12,000 - 270,000    12 WEEKS     15,000 - 220,000        FEMALE AND NON-PREGNANT FEMALE:     LESS THAN 5 mIU/mL Performed at  [facility], [address] [place], [place] [address]     US Ob Less Than 14 Weeks With Ob Transvaginal  Result Date: 02/24/2018 CLINICAL DATA:  Vaginal bleeding, first trimester pregnancy. EXAM: OBSTETRIC <14 WK [place] AND TRANSVAGINAL OB US TECHNIQUE: Transvaginal ultrasound examination was performed for complete evaluation of the gestation as well as the maternal uterus, adnexal regions, and pelvic cul-de-sac. Transvaginal technique was performed to assess early pregnancy. COMPARISON:  None. FINDINGS: Intrauterine gestational sac: Single Yolk sac:  Not seen Embryo:  Present Cardiac Activity: Not visualized No fetal movement identified. Subchorionic hemorrhage:  None visualized. Maternal uterus/adnexae: Maternal ovaries appear normal and there is no mass or free fluid seen within either adnexal region. IMPRESSION: Findings are consistent with fetal demise. No cardiac activity visualized. No fetal movement. Electronically Signed   By: Franki Cabot M.D.   On: 02/24/2018 13:39    Review of Systems  Constitutional: Negative for fever.  Gastrointestinal: Negative for abdominal pain.  Musculoskeletal: Negative for back pain.   Physical Exam   Blood pressure 128/71, pulse (!) 112, temperature 98.9 F (37.2 C), temperature source Oral, resp. rate 18, weight 111.5 kg, last menstrual period 11/22/2017, SpO2 100 %, not currently breastfeeding.  Physical Exam  Constitutional: She is oriented to person, place, and time. She appears well-developed and well-nourished. No distress.  HENT:  Head: Normocephalic.  Eyes: Pupils are equal, round, and reactive to light.  Neck: Neck supple.  GI: Soft. She exhibits no distension. There is no tenderness. There is no rebound.  Genitourinary:  Genitourinary Comments: Vagina - Small amount of dark red, mucus like blood noted in the vault.  no odor  Cervix - No contact bleeding, no active bleeding  Bimanual exam: Cervix FT, anterior  GC/Chlam, wet prep  done Chaperone present for exam.   Musculoskeletal: Normal range of motion.  Neurological: She is alert and oriented to person, place, and time.  Skin: Skin is warm. She is not diaphoretic.  Psychiatric: Her behavior is normal.    MAU Course  Procedures  None  MDM  Unable to doppler fetal heart tones O positive blood type [place] ordered Discussed [place] with Dr. Harolyn Rutherford. Had a lenthy conversation with the patient including [place] results and options for delivery. Offered patient D&E vs. Cytotec. Patient opted for cytotec, however she is unable to stay now due to transportation issues. She would like to leave and come back.  Called patient back at 1730: discussed returning tonight or tomorrow, patient would like to come back tonight. Patient should arrive and will be directly admitted to 3rd floor.   Assessment and Plan   A:  1. Incomplete miscarriage   2. Vaginal bleeding in pregnancy, first trimester   3. Date of last menstrual period (LMP) unknown  4. [redacted] weeks gestation of pregnancy   5. Vaginal bleeding in pregnancy, second trimester     P:  Discharge home in stable condition I will call patient and discuss with her the plan of care once she is settled at home Support given  Noni Saupe I, NP 02/24/2018 5:39 PM

## 2018-02-25 ENCOUNTER — Inpatient Hospital Stay (HOSPITAL_COMMUNITY)
Admission: AD | Admit: 2018-02-25 | Discharge: 2018-02-26 | DRG: 779 | Disposition: A | Discharge status: Home / Self Care | Payer: Medicaid Other | Attending: Obstetrics & Gynecology | Admitting: Obstetrics & Gynecology

## 2018-02-25 DIAGNOSIS — Z87891 Personal history of nicotine dependence: Secondary | ICD-10-CM

## 2018-02-25 DIAGNOSIS — Z3A15 15 weeks gestation of pregnancy: Secondary | ICD-10-CM

## 2018-02-25 DIAGNOSIS — O021 Missed abortion: Principal | ICD-10-CM | POA: Diagnosis present

## 2018-02-25 LAB — GC/CHLAMYDIA PROBE AMP (~~LOC~~) NOT AT ARMC
Chlamydia: NEGATIVE
NEISSERIA GONORRHEA: NEGATIVE

## 2018-02-25 LAB — HIV ANTIBODY (ROUTINE TESTING W REFLEX): HIV Screen 4th Generation wRfx: NONREACTIVE

## 2018-02-25 NOTE — MAU Note (Signed)
Pt was here yesterday and was told the baby had no heartbeat. Was recommend to be admitted and induced. Pt could not stay. Here tonight because she is starting to have cramping. Pt to be admited uop to L&D

## 2018-02-26 ENCOUNTER — Other Ambulatory Visit: Payer: Self-pay

## 2018-02-26 ENCOUNTER — Encounter (HOSPITAL_COMMUNITY): Payer: Self-pay

## 2018-02-26 DIAGNOSIS — Z87891 Personal history of nicotine dependence: Secondary | ICD-10-CM | POA: Diagnosis not present

## 2018-02-26 DIAGNOSIS — O021 Missed abortion: Secondary | ICD-10-CM | POA: Diagnosis present

## 2018-02-26 DIAGNOSIS — O364XX0 Maternal care for intrauterine death, not applicable or unspecified: Secondary | ICD-10-CM | POA: Diagnosis not present

## 2018-02-26 DIAGNOSIS — Z3A15 15 weeks gestation of pregnancy: Secondary | ICD-10-CM

## 2018-02-26 LAB — URINALYSIS, ROUTINE W REFLEX MICROSCOPIC
Bilirubin Urine: NEGATIVE
Glucose, UA: NEGATIVE mg/dL
Ketones, ur: NEGATIVE mg/dL
Nitrite: NEGATIVE
PROTEIN: NEGATIVE mg/dL
SPECIFIC GRAVITY, URINE: 1.012 (ref 1.005–1.030)
pH: 6 (ref 5.0–8.0)

## 2018-02-26 LAB — CBC WITH DIFFERENTIAL/PLATELET
BASOS PCT: 0 %
Basophils Absolute: 0 10*3/uL (ref 0.0–0.1)
EOS PCT: 2 %
Eosinophils Absolute: 0.2 10*3/uL (ref 0.0–0.7)
HEMATOCRIT: 37.2 % (ref 36.0–46.0)
Hemoglobin: 12.3 g/dL (ref 12.0–15.0)
LYMPHS PCT: 29 %
Lymphs Abs: 2.9 10*3/uL (ref 0.7–4.0)
MCH: 26.1 pg (ref 26.0–34.0)
MCHC: 33.1 g/dL (ref 30.0–36.0)
MCV: 79 fL (ref 78.0–100.0)
MONOS PCT: 6 %
Monocytes Absolute: 0.6 10*3/uL (ref 0.1–1.0)
NEUTROS ABS: 6 10*3/uL (ref 1.7–7.7)
Neutrophils Relative %: 63 %
PLATELETS: 244 10*3/uL (ref 150–400)
RBC: 4.71 MIL/uL (ref 3.87–5.11)
RDW: 15.1 % (ref 11.5–15.5)
WBC: 9.7 10*3/uL (ref 4.0–10.5)

## 2018-02-26 LAB — RAPID URINE DRUG SCREEN, HOSP PERFORMED
Amphetamines: NOT DETECTED
BENZODIAZEPINES: NOT DETECTED
Barbiturates: NOT DETECTED
Cocaine: NOT DETECTED
Opiates: NOT DETECTED
Tetrahydrocannabinol: NOT DETECTED

## 2018-02-26 LAB — TYPE AND SCREEN
ABO/RH(D): O POS
Antibody Screen: NEGATIVE

## 2018-02-26 LAB — RPR: RPR Ser Ql: NONREACTIVE

## 2018-02-26 MED ORDER — WITCH HAZEL-GLYCERIN EX PADS: 1.0000 "application " | MEDICATED_PAD | CUTANEOUS | Status: DC | PRN

## 2018-02-26 MED ORDER — OXYCODONE-ACETAMINOPHEN 5-325 MG PO TABS: 1.0000 | ORAL_TABLET | ORAL | Status: DC | PRN

## 2018-02-26 MED ORDER — LIDOCAINE HCL (PF) 1 % IJ SOLN
30.0000 mL | INTRAMUSCULAR | Status: DC | PRN
  Filled 2018-02-26: qty 30

## 2018-02-26 MED ORDER — MISOPROSTOL 200 MCG PO TABS
800.0000 ug | ORAL_TABLET | Freq: Once | ORAL | Status: AC
  Administered 2018-02-26: 800 ug via BUCCAL

## 2018-02-26 MED ORDER — COCONUT OIL OIL
1.0000 "application " | TOPICAL_OIL | Status: DC | PRN
  Filled 2018-02-26: qty 120

## 2018-02-26 MED ORDER — ACETAMINOPHEN 325 MG PO TABS: 650.0000 mg | ORAL_TABLET | ORAL | Status: DC | PRN

## 2018-02-26 MED ORDER — DIBUCAINE 1 % RE OINT
1.0000 "application " | TOPICAL_OINTMENT | RECTAL | Status: DC | PRN
  Filled 2018-02-26: qty 28

## 2018-02-26 MED ORDER — IBUPROFEN 600 MG PO TABS
600.0000 mg | ORAL_TABLET | Freq: Four times a day (QID) | ORAL | Status: DC
  Administered 2018-02-26 (×2): 600 mg via ORAL
  Filled 2018-02-26 (×2): qty 1

## 2018-02-26 MED ORDER — OXYTOCIN BOLUS FROM INFUSION: 500.0000 mL | Freq: Once | INTRAVENOUS | Status: DC

## 2018-02-26 MED ORDER — OXYTOCIN 10 UNIT/ML IJ SOLN
INTRAMUSCULAR | Status: AC
  Filled 2018-02-26: qty 1

## 2018-02-26 MED ORDER — DIPHENHYDRAMINE HCL 25 MG PO CAPS: 25.0000 mg | ORAL_CAPSULE | Freq: Four times a day (QID) | ORAL | Status: DC | PRN

## 2018-02-26 MED ORDER — IBUPROFEN 600 MG PO TABS: 600.0000 mg | ORAL_TABLET | Freq: Four times a day (QID) | ORAL | 2 refills | Status: DC | PRN

## 2018-02-26 MED ORDER — LACTATED RINGERS IV SOLN: 500.0000 mL | INTRAVENOUS | Status: DC | PRN

## 2018-02-26 MED ORDER — BENZOCAINE-MENTHOL 20-0.5 % EX AERO
1.0000 "application " | INHALATION_SPRAY | CUTANEOUS | Status: DC | PRN
  Filled 2018-02-26: qty 56

## 2018-02-26 MED ORDER — LACTATED RINGERS IV SOLN
INTRAVENOUS | Status: DC
  Administered 2018-02-26: 01:00:00 via INTRAVENOUS

## 2018-02-26 MED ORDER — ZOLPIDEM TARTRATE 5 MG PO TABS: 5.0000 mg | ORAL_TABLET | Freq: Every evening | ORAL | Status: DC | PRN

## 2018-02-26 MED ORDER — SOD CITRATE-CITRIC ACID 500-334 MG/5ML PO SOLN: 30.0000 mL | ORAL | Status: DC | PRN

## 2018-02-26 MED ORDER — PROMETHAZINE HCL 25 MG/ML IJ SOLN: 12.5000 mg | INTRAMUSCULAR | Status: DC | PRN

## 2018-02-26 MED ORDER — OXYCODONE-ACETAMINOPHEN 5-325 MG PO TABS: 2.0000 | ORAL_TABLET | ORAL | Status: DC | PRN

## 2018-02-26 MED ORDER — OXYTOCIN 40 UNITS IN LACTATED RINGERS INFUSION - SIMPLE MED
2.5000 [IU]/h | INTRAVENOUS | Status: DC
  Filled 2018-02-26: qty 1000

## 2018-02-26 MED ORDER — ONDANSETRON HCL 4 MG/2ML IJ SOLN: 4.0000 mg | INTRAMUSCULAR | Status: DC | PRN

## 2018-02-26 MED ORDER — TETANUS-DIPHTH-ACELL PERTUSSIS 5-2.5-18.5 LF-MCG/0.5 IM SUSP: 0.5000 mL | Freq: Once | INTRAMUSCULAR | Status: DC

## 2018-02-26 MED ORDER — ONDANSETRON HCL 4 MG/2ML IJ SOLN
4.0000 mg | Freq: Four times a day (QID) | INTRAMUSCULAR | Status: DC | PRN
  Filled 2018-02-26: qty 2

## 2018-02-26 MED ORDER — MISOPROSTOL 200 MCG PO TABS
ORAL_TABLET | ORAL | Status: AC
  Administered 2018-02-26: 800 ug via BUCCAL
  Filled 2018-02-26: qty 5

## 2018-02-26 MED ORDER — SIMETHICONE 80 MG PO CHEW: 80.0000 mg | CHEWABLE_TABLET | ORAL | Status: DC | PRN

## 2018-02-26 MED ORDER — OXYCODONE HCL 5 MG PO TABS: 5.0000 mg | ORAL_TABLET | ORAL | Status: DC | PRN

## 2018-02-26 MED ORDER — ONDANSETRON HCL 4 MG PO TABS: 4.0000 mg | ORAL_TABLET | ORAL | Status: DC | PRN

## 2018-02-26 MED ORDER — PRENATAL MULTIVITAMIN CH: 1.0000 | ORAL_TABLET | Freq: Every day | ORAL | Status: DC

## 2018-02-26 MED ORDER — BUTORPHANOL TARTRATE 1 MG/ML IJ SOLN
2.0000 mg | INTRAMUSCULAR | Status: DC | PRN
  Filled 2018-02-26: qty 2

## 2018-02-26 MED ORDER — SENNOSIDES-DOCUSATE SODIUM 8.6-50 MG PO TABS: 2.0000 | ORAL_TABLET | ORAL | Status: DC

## 2018-02-26 NOTE — Discharge Summary (Addendum)
Physician Discharge Summary  Patient ID: Meredith [name] MRN: [id number] DOB/AGE: 02-01-1991 27 y.o.  Admit date: 02/25/2018 Discharge date: 02/26/2018  Admission and Discharge Diagnoses:  Patient Active Problem List   Diagnosis Date Noted  . Vaginal delivery 02/26/2018  . Fetal demise at [redacted] weeks gestation 09/24/2017    Discharged Condition: good  Hospital Course: Patient was diagnosed with fetal demise at 15 weeks on 02/24/2018. Given options of misoprostol induction and D&E and was admitted for induction, but she left AMA prior to induction. She returned on 02/25/18 with bleeding and had a delivery of a previable nonviable infant. Misoprostol given, placenta delivered. No complications. Please refer to delivery summary for further details.  She was observed for several hours, no further concerns. She was discharged to home with plans for outpatient follow up. Appropriate support given to her and family, she agrees to see [facility] counselor in the office and this will be arranged for her.  No obvious etiology for the demise, will follow up pathology studies.    Significant Diagnostic Studies:  Results for orders placed or performed during the hospital encounter of 02/25/18 (from the past 72 hour(s))  Urinalysis, Routine w reflex microscopic     Status: Abnormal   Collection Time: 02/25/18 11:47 PM  Result Value Ref Range   Color, Urine YELLOW YELLOW   APPearance CLOUDY (A) CLEAR   Specific Gravity, Urine 1.012 1.005 - 1.030   pH 6.0 5.0 - 8.0   Glucose, UA NEGATIVE NEGATIVE mg/dL   Hgb urine dipstick LARGE (A) NEGATIVE   Bilirubin Urine NEGATIVE NEGATIVE   Ketones, ur NEGATIVE NEGATIVE mg/dL   Protein, ur NEGATIVE NEGATIVE mg/dL   Nitrite NEGATIVE NEGATIVE   Leukocytes, UA SMALL (A) NEGATIVE   RBC / HPF 0-5 0 - 5 RBC/hpf   WBC, UA 11-20 0 - 5 WBC/hpf   Bacteria, UA RARE (A) NONE SEEN   Squamous Epithelial / LPF 6-10 0 - 5   Mucus PRESENT     Comment: Performed at [facility], [address] [place], [place] [address]  Urine rapid drug screen (hosp performed)     Status: None   Collection Time: 02/25/18 11:51 PM  Result Value Ref Range   Opiates NONE DETECTED NONE DETECTED   Cocaine NONE DETECTED NONE DETECTED   Benzodiazepines NONE DETECTED NONE DETECTED   Amphetamines NONE DETECTED NONE DETECTED   Tetrahydrocannabinol NONE DETECTED NONE DETECTED   Barbiturates NONE DETECTED NONE DETECTED    Comment: (NOTE) DRUG SCREEN FOR MEDICAL PURPOSES ONLY.  IF CONFIRMATION IS NEEDED FOR ANY PURPOSE, NOTIFY LAB WITHIN 5 DAYS. LOWEST DETECTABLE LIMITS FOR URINE DRUG SCREEN Drug Class                     Cutoff (ng/mL) Amphetamine and metabolites    1000 Barbiturate and metabolites    200 Benzodiazepine                 200 Tricyclics and metabolites     300 Opiates and metabolites        300 Cocaine and metabolites        300 THC                            50 Performed at [facility], [address] [place], [place] [address]   Type and screen [facility] [place]     Status: None   Collection Time: 02/26/18  1:03 AM  Result Value Ref Range   ABO/RH(D) O POS    Antibody Screen NEG    Sample Expiration      03/01/2018 Performed at [facility], [address], [place], [place] [address]   CBC with Differential/Platelet     Status: None   Collection Time: 02/26/18  1:03 AM  Result Value Ref Range   WBC 9.7 4.0 - 10.5 K/uL   RBC 4.71 3.87 - 5.11 MIL/uL   Hemoglobin 12.3 12.0 - 15.0 g/dL   HCT 69.6 29.5 - 28.4 %   MCV 79.0 78.0 - 100.0 fL   MCH 26.1 26.0 - 34.0 pg   MCHC 33.1 30.0 - 36.0 g/dL   RDW 13.2 44.0 - 10.2 %   Platelets 244 150 - 400 K/uL   Neutrophils Relative % 63 %   Neutro Abs 6.0 1.7 - 7.7 K/uL   Lymphocytes Relative 29 %   Lymphs Abs 2.9 0.7 - 4.0 K/uL   Monocytes Relative 6 %   Monocytes Absolute 0.6 0.1 - 1.0 K/uL   Eosinophils Relative 2 %   Eosinophils Absolute 0.2 0.0 - 0.7 K/uL   Basophils Relative 0 %    Basophils Absolute 0.0 0.0 - 0.1 K/uL    Comment: Performed at [facility], [address], [place], [place] [address]   [place] Mfm Ob Comp + 14 Wk  Result Date: 02/24/2018 ----------------------------------------------------------------------  OBSTETRICS REPORT                       (Signed Final 02/24/2018 04:51 pm) ---------------------------------------------------------------------- Patient Info  ID #:       [id number]                          D.O.B.:  09-09-1990 (27 yrs)  Name:       Laqueta Due                  Visit Date: 02/24/2018 03:48 pm ---------------------------------------------------------------------- Performed By  Performed By:     Fayne Norrie BS,      Referred By:      MAU Nursing-                    RDMS, RVT                                MAU/Triage  Attending:        Noralee Space MD        Location:         [facility] ---------------------------------------------------------------------- Orders   #  Description                          Code         Ordered By   1  [place] MFM OB COMP + 14 WK               59563.87     JENNIFER RASCH  ----------------------------------------------------------------------   #  Order #                    Accession #                 Episode #   1  [id number]  [id number]                  [id number]  ---------------------------------------------------------------------- Indications   [redacted] weeks gestation of pregnancy                Z3A.15   Vaginal bleeding in pregnancy, second          O46.92   trimester  ---------------------------------------------------------------------- Fetal Evaluation  Num Of Fetuses:         1  Cardiac Activity:       Not visualized  Presentation:           Cephalic  Placenta:               Anterior  Amniotic Fluid  AFI FV:      Within normal limits                              Largest Pocket(cm)                              3.0 ---------------------------------------------------------------------- Biometry  GS:           3  mm     G. Age:  4w 4d                   EDD:   10/30/18  BPD:      25.8  mm     G. Age:  14w 3d         17  %    CI:        65.87   %    70 - 86                                                          FL/HC:      18.1   %    15.3 - 17.1  HC:       102   mm     G. Age:  14w 6d         20  %    HC/AC:      1.15        1.05 - 1.39  AC:       88.6  mm     G. Age:  15w 0d         57  %    FL/BPD:     71.7   %  FL:       18.5  mm     G. Age:  15w 3d         65  %    FL/AC:      20.9   %    20 - 24  Est. FW:     118  gm      0 lb 4 oz   > 75  % ---------------------------------------------------------------------- OB History  Gravidity:    6          SAB:   2  Living:       3 ---------------------------------------------------------------------- Gestational Age  LMP:           13w 3d        Date:  11/22/17  EDD:   08/29/18  U/S Today:     15w 0d                                        EDD:   08/18/18  Best:          15w 0d     Det. By:  U/S (02/24/18)           EDD:   08/18/18 ---------------------------------------------------------------------- Cervix Uterus Adnexa  Cervix  Normal appearance by transabdominal scan. ---------------------------------------------------------------------- Impression  On ultrasound, fetal biometry is consistent with 15 weeks'  gestation. Unfortunately, no fetal heart activity is seen.  Placenta is anterior and there is no retroplacental  hemorrhage.  Findings are consistent with intrauterine fetal demise.  Ob provider is aware of the findings. ----------------------------------------------------------------------                  Noralee Spaceavi Shankar, MD Electronically Signed Final Report   02/24/2018 04:51 pm ----------------------------------------------------------------------   Discharge Exam: Blood pressure (!) 102/57, pulse 61, temperature 99.1 F (37.3 C), temperature source Oral, resp. rate 16, last menstrual period 11/22/2017, not currently breastfeeding. General  appearance: alert and no distress Cardio: regular rate and rhythm GI: soft, non-tender; bowel sounds normal; no masses,  no organomegaly Pelvic: deferred Extremities: extremities normal, atraumatic, no cyanosis or edema, Homans sign is negative, no sign of DVT and no edema, redness or tenderness in the calves or thighs  Disposition: Discharge disposition: 01-Home or Self Care        Allergies as of 02/26/2018   No Known Allergies     Medication List    STOP taking these medications   prenatal multivitamin Tabs tablet     TAKE these medications   ibuprofen 600 MG tablet Commonly known as:  ADVIL,MOTRIN Take 1 tablet (600 mg total) by mouth every 6 (six) hours as needed.      Follow-up Information    [facility]   Specialty:  Obstetrics and Gynecology Why:  You will be called with appointment details Contact information: [address] [place] [phone]          Signed: Jaynie CollinsUgonna Michon Kaczmarek, MD 02/26/2018, 10:18 AM

## 2018-02-26 NOTE — Progress Notes (Signed)
Pt was lying in bed when I arrived; SO was bedside. She said he did not speak English so the visit was btwn pt and me. She wanted prayer which I provided. Pt said she was doing ok otherwise. I offered continued support if she needs it.  Chaplain Marjory Liesamela Habib Kise Holder, MDiv   02/26/18 0700  Clinical Encounter Type  Visited With Patient and family together

## 2018-02-26 NOTE — Discharge Instructions (Signed)

## 2018-02-26 NOTE — H&P (Addendum)
OBSTETRIC ADMISSION HISTORY AND PHYSICAL  Meredith [name] is a 27 y.o. female [phone] with IUP at [redacted]w[redacted]d by LMP presenting for IOL d/t IUFD. She was seen in MAU on 02/24/18 for light vaginal bleeding and lower back pain x 1 day. [place] confirmed 15wk IUFD w/ no FHR or FM detected. Plan was for admission for IOL w/ Cytotec, but unable to proceed due to patient transportation issues and pt left AMA. Returned last night ~ 2330 w/ abdominal cramping. Pregnancy complicated by [facility]. Past obstetrical history significant for prev IUFD @ [redacted] weeks gestation.   Dating: By LMP --->  Estimated Date of Delivery: 08/18/18  Prenatal History/Complications:  Past Medical History: Past Medical History:  Diagnosis Date  . Medical history non-contributory   . No pertinent past medical history     Past Surgical History: Past Surgical History:  Procedure Laterality Date  . CESAREAN SECTION  04/29/2011   Procedure: CESAREAN SECTION;  Surgeon: Roseanna Rainbow, MD;  Location: [facility];  Service: Gynecology;  Laterality: N/A;  Primary Cesarian Section    Obstetrical History: OB History    Gravida  8   Para  3   Term  3   Preterm      AB  3   Living  3     SAB  3   TAB      Ectopic      Multiple  0   Live Births  3        Obstetric Comments  5 month loss        Social History: Social History   Socioeconomic History  . Marital status: Married    Spouse name: Jose  . Number of children: 3  . Years of education: Not on file  . Highest education level: Not on file  Occupational History  . Not on file  Social Needs  . Financial resource strain: Not very hard  . Food insecurity:    Worry: Never true    Inability: Never true  . Transportation needs:    Medical: No    Non-medical: No  Tobacco Use  . Smoking status: Former Smoker    Packs/day: 0.25    Types: Cigarettes    Last attempt to quit: 03/05/2015    Years since quitting: 2.9  . Smokeless tobacco: Never Used  Substance and  Sexual Activity  . Alcohol use: No  . Drug use: No  . Sexual activity: Yes    Birth control/protection: None  Lifestyle  . Physical activity:    Days per week: 4 days    Minutes per session: 90 min  . Stress: Only a little  Relationships  . Social connections:    Talks on phone: Three times a week    Gets together: Three times a week    Attends religious service: More than 4 times per year    Active member of club or organization: No    Attends meetings of clubs or organizations: Never    Relationship status: Married  Other Topics Concern  . Not on file  Social History Narrative   Marital status: married since 12/2015; from [place]; family from [place]      Children: 3 children (4 yo, 3yo)      Lives: with husband, 3 children      Employment: homemaker      Tobacco: none      Alcohol: socially/weekends      Drugs: none      Exercise: walking  Family History: Family History  Problem Relation Age of Onset  . Cancer Maternal Grandmother   . Diabetes Neg Hx   . Hyperlipidemia Neg Hx   . Hypertension Neg Hx   . Kidney disease Neg Hx   . Stroke Neg Hx     Allergies: No Known Allergies  Medications Prior to Admission  Medication Sig Dispense Refill Last Dose  . Prenatal Vit-Fe Fumarate-FA (PRENATAL MULTIVITAMIN) TABS tablet Take 1 tablet by mouth daily at 12 noon.   02/26/2018 at Unknown time  . ibuprofen (ADVIL,MOTRIN) 600 MG tablet Take 1 tablet (600 mg total) by mouth every 6 (six) hours. (Patient not taking: Reported on 10/21/2017) 30 tablet 0 Not Taking     Review of Systems   All systems reviewed and negative except as stated in HPI  Blood pressure 134/75, pulse 79, temperature 98.1 F (36.7 C), temperature source Oral, resp. rate 20, last menstrual period 11/22/2017, not currently breastfeeding. General appearance: alert, cooperative, appears stated age and no distress Lungs: clear to auscultation bilaterally Heart: regular rate and rhythm Abdomen: soft,  non-tender; bowel sounds normal Pelvic: deferred Extremities: Homans sign is negative, no sign of DV    Prenatal labs: ABO, Rh: --/--/O POS (04/12 1000) Antibody: NEG (04/12 1000) Rubella:  no result available RPR: Non Reactive (04/12 1000)  HBsAg: Negative (04/12 1000)  HIV: Non Reactive (09/12 1152)  GBS:   Negative 1 hr Glucola: not done Genetic screening: not done Anatomy US: fetal biometry consistent w/ 15w gestation, no fetal heart activity seen. Results consistent w/ IUFD (09/12 0451)  Prenatal Transfer Tool  Maternal Diabetes: No Genetic Screening: Not done Maternal Ultrasounds/Referrals: Abnormal:  Findings:   Other: No fetal heart activity seen, consistent with IUFD @15  weeks Fetal Ultrasounds or other Referrals:  None Maternal Substance Abuse:  No Significant Maternal Medications:  None Significant Maternal Lab Results: None  Results for orders placed or performed during the hospital encounter of 02/25/18 (from the past 24 hour(s))  Urinalysis, Routine w reflex microscopic   Collection Time: 02/25/18 11:47 PM  Result Value Ref Range   Color, Urine YELLOW YELLOW   APPearance CLOUDY (A) CLEAR   Specific Gravity, Urine 1.012 1.005 - 1.030   pH 6.0 5.0 - 8.0   Glucose, UA NEGATIVE NEGATIVE mg/dL   Hgb urine dipstick LARGE (A) NEGATIVE   Bilirubin Urine NEGATIVE NEGATIVE   Ketones, ur NEGATIVE NEGATIVE mg/dL   Protein, ur NEGATIVE NEGATIVE mg/dL   Nitrite NEGATIVE NEGATIVE   Leukocytes, UA SMALL (A) NEGATIVE   RBC / HPF 0-5 0 - 5 RBC/hpf   WBC, UA 11-20 0 - 5 WBC/hpf   Bacteria, UA RARE (A) NONE SEEN   Squamous Epithelial / LPF 6-10 0 - 5   Mucus PRESENT   Urine rapid drug screen (hosp performed)   Collection Time: 02/25/18 11:51 PM  Result Value Ref Range   Opiates NONE DETECTED NONE DETECTED   Cocaine NONE DETECTED NONE DETECTED   Benzodiazepines NONE DETECTED NONE DETECTED   Amphetamines NONE DETECTED NONE DETECTED   Tetrahydrocannabinol NONE DETECTED  NONE DETECTED   Barbiturates NONE DETECTED NONE DETECTED  CBC with Differential/Platelet   Collection Time: 02/26/18  1:03 AM  Result Value Ref Range   WBC 9.7 4.0 - 10.5 K/uL   RBC 4.71 3.87 - 5.11 MIL/uL   Hemoglobin 12.3 12.0 - 15.0 g/dL   HCT 14.737.2 82.936.0 - 56.246.0 %   MCV 79.0 78.0 - 100.0 fL   MCH 26.1 26.0 -  34.0 pg   MCHC 33.1 30.0 - 36.0 g/dL   RDW 16.1 09.6 - 04.5 %   Platelets 244 150 - 400 K/uL   Other PENDING %   Neutrophils Relative % 63 %   Neutro Abs 6.0 1.7 - 7.7 K/uL   Lymphocytes Relative 29 %   Lymphs Abs 2.9 0.7 - 4.0 K/uL   Monocytes Relative 6 %   Monocytes Absolute 0.6 0.1 - 1.0 K/uL   Eosinophils Relative 2 %   Eosinophils Absolute 0.2 0.0 - 0.7 K/uL   Basophils Relative 0 %   Basophils Absolute 0.0 0.0 - 0.1 K/uL    Patient Active Problem List   Diagnosis Date Noted  . Intrauterine fetal death before 20 weeks of gestation 02/26/2018  . Fetal demise at [redacted] weeks gestation 09/24/2017    Assessment/Plan:  Izzah Pasqua is a 27 y.o. [id number] at [redacted]w[redacted]d here for IOL d/t IUFD.  Pt is showing signs of possible spontaneous expulsion of POC. Will check cervix and decide whether to monitor for natural progression or induce with Cytotec.   [place], Student-PA  02/26/2018, 1:41 AM  CNM attestation:  I have seen and examined this patient; I agree with above documentation in the student's note.   Pauleen Goleman is a 27 y.o. [phone] here for 15wk known MAB, now with abd cramping  PE: BP 133/67   Pulse 76   Temp 98.1 F (36.7 C) (Oral)   Resp 16   LMP 11/22/2017 (Approximate)   Resp: normal effort, no distress Abd: gravid  ROS, labs, PMH reviewed  Plan: Admit to [facility] Expectant management Comfort measures Decision for genetic testing will be up to the parents  Cam Hai CNM 02/26/2018, 6:40 AM

## 2018-02-26 NOTE — Plan of Care (Signed)
Pt stable post delivery.

## 2018-02-26 NOTE — Progress Notes (Signed)
Chaplain at bedside providing spiritual comfort to patient.

## 2018-03-14 ENCOUNTER — Ambulatory Visit (INDEPENDENT_AMBULATORY_CARE_PROVIDER_SITE_OTHER): Payer: Medicaid Other | Admitting: Clinical

## 2018-03-14 DIAGNOSIS — F4321 Adjustment disorder with depressed mood: Secondary | ICD-10-CM | POA: Diagnosis not present

## 2018-03-14 NOTE — BH Specialist Note (Signed)
Integrated Behavioral Health Initial Visit  MRN: [id number] Name: Meredith [name]  Number of Integrated Behavioral Health Clinician visits:: 1/6 Session Start time: 1:20  Session End time: 2:10 Total time: 50 minutes  Type of Service: Integrated Behavioral Health- Individual/Family Interpretor:No. Interpretor Name and Language: n/a   Warm Hand Off Completed.       SUBJECTIVE: Meredith [name] is a 27 y.o. female accompanied by n/a Patient was referred by Jaynie Collins, MD for fetal demise. Patient reports the following symptoms/concerns: Pt states she is coping well with her recent loss at 15wks, as well as her previous loss at about 18mo in April; pt found out about the loss of a family member, due to overdose, shortly after her loss, and is primarily concerned with her family's grief at this time, and preventing a repeat in the family. Pt also worries about child support issues.  Duration of problem: Over 2 weeks; Severity of problem: mild  OBJECTIVE: Mood: Normal and Affect: Appropriate Risk of harm to self or others: No plan to harm self or others  LIFE CONTEXT: Family and Social: Pt lives with her husband and three children School/Work: - Self-Care: - Life Changes: Recent loss(2wks ago); previous loss (over 18mo ago), loss of family member to overdose; custody issues  GOALS ADDRESSED: Patient will: 1. Reduce symptoms of: stress 2. Increase knowledge and/or ability of: healthy habits  3. Demonstrate ability to: Begin healthy grieving over loss  INTERVENTIONS: Interventions utilized: Supportive Counseling and Psychoeducation and/or Health Education  Standardized Assessments completed: GAD-7 and PHQ 9  ASSESSMENT: Patient currently experiencing Grief.   Patient may benefit from psychoeducation and brief therapeutic interventions regarding coping with symptoms of depression related to grief .  PLAN: 1. Follow up with behavioral health clinician on : As  needed 2. Behavioral recommendations:  -Consider hospice overdose support group for self and family -Read educational materials regarding coping with symptoms of depression -Continue to allow friends and family be supportive 3. Referral(s): Integrated Behavioral Health Services (In Clinic) 4. "From scale of 1-10, how likely are you to follow plan?": 10  Rae Lips, LCSW  Depression screen [facility] 2/9 03/14/2018 10/21/2017 04/30/2015  Decreased Interest 0 0 0  Down, Depressed, Hopeless 0 0 0  PHQ - 2 Score 0 0 0  Altered sleeping 0 0 -  Tired, decreased energy 0 0 -  Change in appetite 0 0 -  Feeling bad or failure about yourself  1 0 -  Trouble concentrating 0 0 -  Moving slowly or fidgety/restless 0 0 -  Suicidal thoughts 0 0 -  PHQ-9 Score 1 0 -  Difficult doing work/chores - Not difficult at all -   GAD 7 : Generalized Anxiety Score 03/14/2018  Nervous, Anxious, on Edge 0  Control/stop worrying 0  Worry too much - different things 0  Trouble relaxing 0  Restless 0  Easily annoyed or irritable 0  Afraid - awful might happen 0  Total GAD 7 Score 0

## 2019-06-08 DIAGNOSIS — U071 COVID-19: Secondary | ICD-10-CM

## 2019-06-08 HISTORY — DX: COVID-19: U07.1

## 2019-06-16 ENCOUNTER — Emergency Department (HOSPITAL_BASED_OUTPATIENT_CLINIC_OR_DEPARTMENT_OTHER)
Admission: EM | Admit: 2019-06-16 | Discharge: 2019-06-16 | Disposition: A | Discharge status: Home / Self Care | Payer: Medicaid Other | Attending: Emergency Medicine | Admitting: Emergency Medicine

## 2019-06-16 ENCOUNTER — Encounter (HOSPITAL_BASED_OUTPATIENT_CLINIC_OR_DEPARTMENT_OTHER): Payer: Self-pay

## 2019-06-16 ENCOUNTER — Other Ambulatory Visit: Payer: Self-pay

## 2019-06-16 DIAGNOSIS — R002 Palpitations: Secondary | ICD-10-CM | POA: Insufficient documentation

## 2019-06-16 DIAGNOSIS — F419 Anxiety disorder, unspecified: Secondary | ICD-10-CM

## 2019-06-16 DIAGNOSIS — F17210 Nicotine dependence, cigarettes, uncomplicated: Secondary | ICD-10-CM | POA: Insufficient documentation

## 2019-06-16 LAB — PREGNANCY, URINE: Preg Test, Ur: NEGATIVE

## 2019-06-16 MED ORDER — HYDROXYZINE HCL 25 MG PO TABS: 25.0000 mg | ORAL_TABLET | Freq: Once | ORAL | Status: DC

## 2019-06-16 MED ORDER — HYDROXYZINE HCL 25 MG PO TABS: 25.0000 mg | ORAL_TABLET | Freq: Four times a day (QID) | ORAL | 0 refills | Status: DC

## 2019-06-16 NOTE — ED Triage Notes (Signed)
Pt c/o anxiety since testing pos for covid 12 days ago-NAD-steady gait

## 2019-06-16 NOTE — ED Provider Notes (Signed)
[facility] EMERGENCY DEPARTMENT Provider Note   CSN: [id number] Arrival date & time: 06/16/19  1412     History Chief Complaint  Patient presents with  . Anxiety    Meredith [name] is a 29 y.o. female presents today for anxiety.  Patient reports that 12 days ago she was diagnosed with COVID-19 virus.  She reports that her only symptoms from COVID-19 were loss of taste and smell.  She reports that both her and her children tested positive and had no severe symptoms did not require hospitalization.  Patient reports that she has gradually regained her taste and smell.  Patient reports that since her diagnosis of COVID-19 she has been having increased anxiety.  She reports that she is stressed due to the current pandemic and wanted to be "checked out".  Patient reports that when her anxiety worsens she feels palpitations in her chest that resolved with the resting and deep breath.  She denies any symptoms at time of evaluation.  She reports that she used to be on medications for her anxiety when she was an adolescent but has not been on these medications for many years.  She denies fever/chills, headache, vision changes, neck stiffness, chest pain, difficulty breathing, cough/hemoptysis, extremity swelling/color change, abdominal pain, nausea/vomiting, suicidal ideations, homicidal ideations, drug/alcohol use, injury, hallucinations, ingestion or any additional concerns.  HPI     Past Medical History:  Diagnosis Date  . Medical history non-contributory   . No pertinent past medical history     Patient Active Problem List   Diagnosis Date Noted  . Vaginal delivery 02/26/2018  . Fetal demise at [redacted] weeks gestation 09/24/2017    Past Surgical History:  Procedure Laterality Date  . CESAREAN SECTION  04/29/2011   Procedure: CESAREAN SECTION;  Surgeon: Roseanna Rainbow, MD;  Location: [facility];  Service: Gynecology;  Laterality: N/A;  Primary Cesarian Section     OB History      Gravida  7   Para  4   Term  3   Preterm      AB  2   Living  3     SAB  2   TAB      Ectopic      Multiple  0   Live Births  3        Obstetric Comments  5 month loss        Family History  Problem Relation Age of Onset  . Cancer Maternal Grandmother   . Diabetes Neg Hx   . Hyperlipidemia Neg Hx   . Hypertension Neg Hx   . Kidney disease Neg Hx   . Stroke Neg Hx     Social History   Tobacco Use  . Smoking status: Current Every Day Smoker    Packs/day: 0.25    Types: Cigars  . Smokeless tobacco: Never Used  Substance Use Topics  . Alcohol use: Yes    Comment: occ  . Drug use: No    Home Medications Prior to Admission medications   Medication Sig Start Date End Date Taking? Authorizing Provider  hydrOXYzine (ATARAX/VISTARIL) 25 MG tablet Take 1 tablet (25 mg total) by mouth every 6 (six) hours. 06/16/19   Harlene Salts A, PA-C  ibuprofen (ADVIL,MOTRIN) 600 MG tablet Take 1 tablet (600 mg total) by mouth every 6 (six) hours as needed. 02/26/18   Tereso Newcomer, MD    Allergies    Patient has no known allergies.  Review of Systems  Review of Systems Ten systems are reviewed and are negative for acute change except as noted in the HPI  Physical Exam Updated Vital Signs BP 125/67 (BP Location: Right Arm)   Pulse 95   Temp 98.6 F (37 C) (Oral)   Resp 16   Ht 5\' 3"  (1.6 m)   Wt 108.9 kg   LMP 06/14/2019   SpO2 100%   BMI 42.51 kg/m   Physical Exam Constitutional:      General: She is not in acute distress.    Appearance: Normal appearance. She is well-developed. She is not ill-appearing or diaphoretic.  HENT:     Head: Normocephalic and atraumatic.     Right Ear: External ear normal.     Left Ear: External ear normal.     Nose: Nose normal.     Mouth/Throat:     Mouth: Mucous membranes are moist.     Pharynx: Oropharynx is clear.  Eyes:     General: Vision grossly intact. Gaze aligned appropriately.     Pupils: Pupils are  equal, round, and reactive to light.  Neck:     Trachea: Trachea and phonation normal. No tracheal deviation.  Cardiovascular:     Rate and Rhythm: Normal rate and regular rhythm.     Pulses: Normal pulses.     Heart sounds: Normal heart sounds.  Pulmonary:     Effort: Pulmonary effort is normal. No respiratory distress.     Breath sounds: Normal breath sounds.  Abdominal:     General: There is no distension.     Palpations: Abdomen is soft.     Tenderness: There is no abdominal tenderness. There is no guarding or rebound.  Musculoskeletal:        General: No tenderness. Normal range of motion.     Cervical back: Normal range of motion.     Right lower leg: No edema.     Left lower leg: No edema.  Skin:    General: Skin is warm and dry.  Neurological:     Mental Status: She is alert.     GCS: GCS eye subscore is 4. GCS verbal subscore is 5. GCS motor subscore is 6.     Comments: Speech is clear and goal oriented, follows commands Major Cranial nerves without deficit, no facial droop Moves extremities without ataxia, coordination intact  Psychiatric:        Attention and Perception: She does not perceive auditory or visual hallucinations.        Speech: Speech normal.        Behavior: Behavior normal. Behavior is cooperative.        Thought Content: Thought content does not include homicidal or suicidal ideation.     ED Results / Procedures / Treatments   Labs (all labs ordered are listed, but only abnormal results are displayed) Labs Reviewed  PREGNANCY, URINE    EKG EKG Interpretation  Date/Time:  Friday June 16 2019 17:00:33 EST Ventricular Rate:  87 PR Interval:    QRS Duration: 85 QT Interval:  375 QTC Calculation: 452 R Axis:   43 Text Interpretation: Sinus rhythm Low voltage, precordial leads No previous ECGs available Confirmed by 08-13-2004 (Alvira Monday) on 06/16/2019 5:34:44 PM   Radiology No results found.  Procedures Procedures (including  critical care time)  Medications Ordered in ED Medications - No data to display  ED Course  I have reviewed the triage vital signs and the nursing notes.  Pertinent labs & imaging results that  were available during my care of the patient were reviewed by me and considered in my medical decision making (see chart for details).    MDM Rules/Calculators/A&P                     29 year old female presents today for anxiety related to her COVID-19 diagnosis 12 days ago.  Her only symptoms were loss of taste and smell.  She denies developing cough, shortness of breath, chest pain, nausea/vomiting or any other symptoms.  She reports intermittent anxiety due to pandemic with both her and her kids being diagnosed with Covid.  She reports history of anxiety and she used to take an unknown medication when she was an adolescent but has not had this medication in many years.  She is requesting to be checked out today for her anxiety and palpitations.  She denies any homicidal or suicidal ideations, hallucinations, ingestion, injury or other concerns.  She is well-appearing no acute distress, pleasant and smiling.  Cranial nerves intact, no meningeal signs, heart regular rate and rhythm without murmur, lungs clear to auscultation bilaterally, abdomen soft nontender without peritoneal signs, neurovascular intact to all 4 extremities without evidence of DVT.  Vital signs within normal limits. - EKG: Sinus rhythm Low voltage, precordial leads No previous ECGs available Confirmed by Gareth Morgan [id number]) on 06/16/2019 5:34:44 PM  Pregnancy negative - Plan of care is to provide patient with short prescription of Vistaril for her anxiety.  Mindful stress reduction handout.  PCP referral for follow-up.  Patient fully alert and oriented does not appear to be a danger to herself or others.  She reports improvement of anxiety since ED arrival and has no symptoms.  She has no history of sudden cardiac death, heart sounds  normal.  Based on history and physical examination suspicion for ACS, dissection, PE, carditis, arrhythmia, or other emergent etiology of her symptoms today.  At this time there does not appear to be any evidence of an acute emergency medical condition and the patient appears stable for discharge with appropriate outpatient follow up. Diagnosis was discussed with patient who verbalizes understanding of care plan and is agreeable to discharge. I have discussed return precautions with patient who verbalizes understanding of return precautions. Patient encouraged to follow-up with their PCP. All questions answered.  Patient's case discussed with Dr. Billy Fischer who agrees with plan to discharge with follow-up.   Note: Portions of this report may have been transcribed using voice recognition software. Every effort was made to ensure accuracy; however, inadvertent computerized transcription errors may still be present. Final Clinical Impression(s) / ED Diagnoses Final diagnoses:  Anxiety  Palpitations    Rx / DC Orders ED Discharge Orders         Ordered    hydrOXYzine (ATARAX/VISTARIL) 25 MG tablet  Every 6 hours     06/16/19 1742           Gari Crown 06/16/19 1752    Gareth Morgan, MD 06/18/19 2153

## 2019-06-16 NOTE — Discharge Instructions (Signed)
You have been diagnosed today with Anxiety.  At this time there does not appear to be the presence of an emergent medical condition, however there is always the potential for conditions to change. Please read and follow the below instructions.  Please return to the Emergency Department immediately for any new or worsening symptoms. Please be sure to follow up with your Primary Care Provider within one week regarding your visit today; please call their office to schedule an appointment even if you are feeling better for a follow-up visit. You may take the medication of Vistaril as prescribed to help with your symptoms.  Please drink plenty of water and get plenty of rest.  Get help right away if: You have thoughts of hurting yourself or others. You have chest pain or trouble breathing You have fever or chills Have a very bad headache. Feel dizzy. Pass out (faint). You have symptoms of a panic attack. Do not drive yourself to the hospital. Have someone else drive you or call an ambulance. If you feel like you may hurt yourself or others, or have thoughts about taking your own life, get help right away. You can go to your nearest emergency department or call: Your local emergency services (911 in the [place] A suicide crisis helpline, such as the National Suicide Prevention Lifeline at 9805449843. This is open 24 hours a day.  Please read the additional information packets attached to your discharge summary.  Do not take your medicine if  develop an itchy rash, swelling in your mouth or lips, or difficulty breathing; call 911 and seek immediate emergency medical attention if this occurs.  Note: Portions of this text may have been transcribed using voice recognition software. Every effort was made to ensure accuracy; however, inadvertent computerized transcription errors may still be present.

## 2019-11-24 ENCOUNTER — Inpatient Hospital Stay (HOSPITAL_COMMUNITY)
Admission: AD | Admit: 2019-11-24 | Discharge: 2019-11-24 | Disposition: A | Discharge status: Home / Self Care | Payer: Self-pay | Attending: Obstetrics and Gynecology | Admitting: Obstetrics and Gynecology

## 2019-11-24 ENCOUNTER — Other Ambulatory Visit: Payer: Self-pay

## 2019-11-24 ENCOUNTER — Encounter: Payer: Self-pay | Admitting: *Deleted

## 2019-11-24 ENCOUNTER — Encounter (HOSPITAL_COMMUNITY): Payer: Self-pay | Admitting: Obstetrics and Gynecology

## 2019-11-24 ENCOUNTER — Encounter (HOSPITAL_BASED_OUTPATIENT_CLINIC_OR_DEPARTMENT_OTHER): Payer: Self-pay | Admitting: Obstetrics & Gynecology

## 2019-11-24 ENCOUNTER — Inpatient Hospital Stay (HOSPITAL_COMMUNITY): Payer: Self-pay

## 2019-11-24 DIAGNOSIS — B9689 Other specified bacterial agents as the cause of diseases classified elsewhere: Secondary | ICD-10-CM | POA: Insufficient documentation

## 2019-11-24 DIAGNOSIS — R1030 Lower abdominal pain, unspecified: Secondary | ICD-10-CM | POA: Insufficient documentation

## 2019-11-24 DIAGNOSIS — O209 Hemorrhage in early pregnancy, unspecified: Secondary | ICD-10-CM

## 2019-11-24 DIAGNOSIS — Z87891 Personal history of nicotine dependence: Secondary | ICD-10-CM | POA: Insufficient documentation

## 2019-11-24 DIAGNOSIS — N760 Acute vaginitis: Secondary | ICD-10-CM

## 2019-11-24 DIAGNOSIS — O021 Missed abortion: Secondary | ICD-10-CM | POA: Insufficient documentation

## 2019-11-24 DIAGNOSIS — O23591 Infection of other part of genital tract in pregnancy, first trimester: Secondary | ICD-10-CM | POA: Insufficient documentation

## 2019-11-24 DIAGNOSIS — R109 Unspecified abdominal pain: Secondary | ICD-10-CM

## 2019-11-24 DIAGNOSIS — Z3A12 12 weeks gestation of pregnancy: Secondary | ICD-10-CM | POA: Insufficient documentation

## 2019-11-24 LAB — URINALYSIS, ROUTINE W REFLEX MICROSCOPIC
Bacteria, UA: NONE SEEN
Bilirubin Urine: NEGATIVE
Glucose, UA: NEGATIVE mg/dL
Ketones, ur: NEGATIVE mg/dL
Leukocytes,Ua: NEGATIVE
Nitrite: NEGATIVE
Protein, ur: NEGATIVE mg/dL
Specific Gravity, Urine: 1.018 (ref 1.005–1.030)
pH: 6 (ref 5.0–8.0)

## 2019-11-24 LAB — WET PREP, GENITAL
Sperm: NONE SEEN
Trich, Wet Prep: NONE SEEN
Yeast Wet Prep HPF POC: NONE SEEN

## 2019-11-24 LAB — POCT PREGNANCY, URINE: Preg Test, Ur: POSITIVE — AB

## 2019-11-24 MED ORDER — METRONIDAZOLE 500 MG PO TABS: 500.0000 mg | ORAL_TABLET | Freq: Two times a day (BID) | ORAL | 0 refills | Status: DC

## 2019-11-24 NOTE — MAU Provider Note (Signed)
Chief Complaint: Abdominal Pain and Vaginal Bleeding   First Provider Initiated Contact with Patient 11/24/19 1255     SUBJECTIVE HPI: Meredith [name] is a 29 y.o. [id number] at Unknown who presents to Maternity Admissions reporting abdominal pressure & vaginal bleeding.  Reports increase in discharge yesterday and bleeding today. Has seen bright red blood on toilet paper. Not saturating pads or passing blood clots. Reports lower abdominal cramping & pelvic pressure. History of 15 wk loss in 2019 (IUFD). Has not had prenatal care yet but plans on going to [facility] for prenatal care. Had a positive HCG last month at [facility].   Location: abdomen Quality: cramping Severity: 2/10 on pain scale Duration: 2 days Timing: intermittent Modifying factors: none Associated signs and symptoms: vaginal bleeding  Past Medical History:  Diagnosis Date  . Medical history non-contributory    OB History  Gravida Para Term Preterm AB Living  7 4 3   2 3   SAB TAB Ectopic Multiple Live Births  2     0 3    # Outcome Date GA Lbr Len/2nd Weight Sex Delivery Anes PTL Lv  7 Current           6 Para 02/26/18 [redacted]w[redacted]d   U Vag-Spont None  FD  5 SAB 09/24/17          4 Term 04/16/16 [redacted]w[redacted]d 04:56 / 00:13 3940 g F VBAC EPI  LIV  3 Term 07/18/13 [redacted]w[redacted]d / 01:00 3940 g M VBAC EPI  LIV  2 SAB 09/2012 [redacted]w[redacted]d         1 Term 04/29/11 [redacted]w[redacted]d / 05:22 3230 g F CS-LTranv EPI  LIV     Birth Comments: normal newborn    Obstetric Comments  5 month loss   Past Surgical History:  Procedure Laterality Date  . CESAREAN SECTION  04/29/2011   Procedure: CESAREAN SECTION;  Surgeon: Agnes Garret Teale, MD;  Location: [facility];  Service: Gynecology;  Laterality: N/A;  Primary Cesarian Section   Social History   Socioeconomic History  . Marital status: Married    Spouse name: Jose  . Number of children: 3  . Years of education: Not on file  . Highest education level: Not on file  Occupational History  . Not on file  Tobacco  Use  . Smoking status: Former Smoker    Packs/day: 0.25    Types: Cigars  . Smokeless tobacco: Never Used  Vaping Use  . Vaping Use: Never used  Substance and Sexual Activity  . Alcohol use: Yes    Comment: occ  . Drug use: No  . Sexual activity: Yes    Birth control/protection: None  Other Topics Concern  . Not on file  Social History Narrative   Marital status: married since 12/2015; from [place]; family from [place]      Children: 3 children (31 yo, 3yo)      Lives: with husband, 3 children      Employment: homemaker      Tobacco: none      Alcohol: socially/weekends      Drugs: none      Exercise: walking   Social Determinants of Health   Financial Resource Strain:   . Difficulty of Paying Living Expenses:   Food Insecurity:   . Worried About Charity fundraiser in the Last Year:   . Arboriculturist in the Last Year:   Transportation Needs:   . Film/video editor (Medical):   Marland Kitchen Lack of  Transportation (Non-Medical):   Physical Activity:   . Days of Exercise per Week:   . Minutes of Exercise per Session:   Stress:   . Feeling of Stress :   Social Connections:   . Frequency of Communication with Friends and Family:   . Frequency of Social Gatherings with Friends and Family:   . Attends Religious Services:   . Active Member of Clubs or Organizations:   . Attends Banker Meetings:   Marland Kitchen Marital Status:   Intimate Partner Violence:   . Fear of Current or Ex-Partner:   . Emotionally Abused:   Marland Kitchen Physically Abused:   . Sexually Abused:    Family History  Problem Relation Age of Onset  . Cancer Maternal Grandmother   . Diabetes Neg Hx   . Hyperlipidemia Neg Hx   . Hypertension Neg Hx   . Kidney disease Neg Hx   . Stroke Neg Hx    No current facility-administered medications on file prior to encounter.   Current Outpatient Medications on File Prior to Encounter  Medication Sig Dispense Refill  . Prenatal Vit-Fe Fumarate-FA (PRENATAL MULTIVITAMIN)  TABS tablet Take 1 tablet by mouth daily at 12 noon.     No Known Allergies  I have reviewed patient's Past Medical Hx, Surgical Hx, Family Hx, Social Hx, medications and allergies.   Review of Systems  Constitutional: Negative.   Gastrointestinal: Positive for abdominal pain. Negative for constipation, diarrhea, nausea and vomiting.  Genitourinary: Positive for vaginal bleeding and vaginal discharge. Negative for dysuria.    OBJECTIVE Patient Vitals for the past 24 hrs:  BP Temp Temp src Pulse Resp SpO2 Height Weight  11/24/19 1523 126/87 -- -- 95 16 -- -- --  11/24/19 1202 133/68 99.1 F (37.3 C) Oral 97 15 100 % 5\' 3"  (1.6 m) 109 kg   Constitutional: Well-developed, well-nourished female in no acute distress.  Cardiovascular: normal rate & rhythm, no murmur Respiratory: normal rate and effort. Lung sounds clear throughout GI: Abd soft, non-tender, Pos BS x 4. No guarding or rebound tenderness MS: Extremities nontender, no edema, normal ROM Neurologic: Alert and oriented x 4.  GU:     SPECULUM EXAM: NEFG, small amount of dark red mucoid blood. Small amount of foul smelling tan discharge.   BIMANUAL: No CMT. cervix closed; uterus enlarged, no adnexal tenderness or masses.    LAB RESULTS Results for orders placed or performed during the hospital encounter of 11/24/19 (from the past 24 hour(s))  Urinalysis, Routine w reflex microscopic     Status: Abnormal   Collection Time: 11/24/19 12:12 PM  Result Value Ref Range   Color, Urine YELLOW YELLOW   APPearance CLEAR CLEAR   Specific Gravity, Urine 1.018 1.005 - 1.030   pH 6.0 5.0 - 8.0   Glucose, UA NEGATIVE NEGATIVE mg/dL   Hgb urine dipstick LARGE (A) NEGATIVE   Bilirubin Urine NEGATIVE NEGATIVE   Ketones, ur NEGATIVE NEGATIVE mg/dL   Protein, ur NEGATIVE NEGATIVE mg/dL   Nitrite NEGATIVE NEGATIVE   Leukocytes,Ua NEGATIVE NEGATIVE   RBC / HPF 0-5 0 - 5 RBC/hpf   WBC, UA 0-5 0 - 5 WBC/hpf   Bacteria, UA NONE SEEN NONE SEEN    Squamous Epithelial / LPF 6-10 0 - 5   Mucus PRESENT   Pregnancy, urine POC     Status: Abnormal   Collection Time: 11/24/19 12:13 PM  Result Value Ref Range   Preg Test, Ur POSITIVE (A) NEGATIVE  Wet prep, genital  Status: Abnormal   Collection Time: 11/24/19  1:07 PM  Result Value Ref Range   Yeast Wet Prep HPF POC NONE SEEN NONE SEEN   Trich, Wet Prep NONE SEEN NONE SEEN   Clue Cells Wet Prep HPF POC PRESENT (A) NONE SEEN   WBC, Wet Prep HPF POC MANY (A) NONE SEEN   Sperm NONE SEEN     IMAGING US OB LESS THAN 14 WEEKS WITH OB TRANSVAGINAL  Result Date: 11/24/2019 CLINICAL DATA:  Vaginal bleeding, cramps in a patient with last menstrual period of 07/26/2019, gestational age by last menstrual. Seventeen weeks 2 days. EXAM: OBSTETRIC <14 WK [place] AND TRANSVAGINAL OB US TECHNIQUE: Both transabdominal and transvaginal ultrasound examinations were performed for complete evaluation of the gestation as well as the maternal uterus, adnexal regions, and pelvic cul-de-sac. Transvaginal technique was performed to assess early pregnancy. COMPARISON:  03/03/2018 FINDINGS: Intrauterine gestational sac: Single Yolk sac:  Not Visualized. Embryo:  Visualized. Cardiac Activity: Not Visualized. CRL: 62.1 mm   12 w   4 d                  [place] EDC: Not applicable Subchorionic hemorrhage:  None visualized. Maternal uterus/adnexae: Adnexal structures without adnexal mass. Ovaries not visualized. Small fluid in the cervix potentially small amount of blood. IMPRESSION: Findings meet definitive criteria for failed pregnancy. Crown-rump length of 62 mm without fetal heart rate. This follows SRU consensus guidelines: Diagnostic Criteria for Nonviable Pregnancy Early in the First Trimester. [facility] Mis J Med [phone]. Electronically Signed   By: Donzetta Kohut M.D.   On: 11/24/2019 14:53    MAU COURSE Orders Placed This Encounter  Procedures  . Wet prep, genital  . US OB LESS THAN 14 WEEKS WITH OB TRANSVAGINAL   . Urinalysis, Routine w reflex microscopic  . Pregnancy, urine POC  . Discharge patient   Meds ordered this encounter  Medications  . metroNIDAZOLE (FLAGYL) 500 MG tablet    Sig: Take 1 tablet (500 mg total) by mouth 2 (two) times daily.    Dispense:  14 tablet    Refill:  0    Order Specific Question:   Supervising Provider    Answer:   Hermina Staggers [[id number]]    MDM RN unable to doppler FHT. Patient 12-[redacted] wks gestation per LMP vs HCG (as told to patient by her PCP).   RH positive  Wet prep + clue cells. Will tx for BV.  Ultrasound shows IUP measuring [redacted]w[redacted]d, no fhr; definitive for failed pregnancy.   Discussed results with patient. Reviewed recommendation to be scheduled for D&E due to gestational age. Patient agreeable with plan.   Reviewed reasons to return to MAU until scheduled procedure. Pelvic rest until follow up.   ASSESSMENT 1. Missed abortion with fetal demise before 20 completed weeks of gestation   2. Vaginal bleeding in pregnancy, first trimester   3. Abdominal cramping affecting pregnancy   4. [redacted] weeks gestation of pregnancy   5. Bacterial vaginosis     PLAN Discharge home in stable condition. Msg to scheduler for D&E Discussed reasons to return to MAU - heavy bleeding, severe pain, fever  Pelvic rest Rx flagyl GC/CT pending  Allergies as of 11/24/2019   No Known Allergies     Medication List    STOP taking these medications   hydrOXYzine 25 MG tablet Commonly known as: ATARAX/VISTARIL   ibuprofen 600 MG tablet Commonly known as: ADVIL     TAKE these medications  metroNIDAZOLE 500 MG tablet Commonly known as: FLAGYL Take 1 tablet (500 mg total) by mouth 2 (two) times daily.   prenatal multivitamin Tabs tablet Take 1 tablet by mouth daily at 12 noon.        Judeth Horn, NP 11/24/2019  4:21 PM

## 2019-11-24 NOTE — Discharge Instructions (Signed)
Return to care  °· If you have heavier bleeding that soaks through more that 2 pads per hour for an hour or more °· If you bleed so much that you feel like you might pass out or you do pass out °· If you have significant abdominal pain that is not improved with Tylenol  °· If you develop a fever > 100.5 ° ° °Miscarriage °A miscarriage is the loss of an unborn baby (fetus) before the 20th week of pregnancy. Most miscarriages happen during the first 3 months of pregnancy. Sometimes, a miscarriage can happen before a woman knows that she is pregnant. °Having a miscarriage can be an emotional experience. If you have had a miscarriage, talk with your health care provider about any questions you may have about miscarrying, the grieving process, and your plans for future pregnancy. °What are the causes? °A miscarriage may be caused by: °· Problems with the genes or chromosomes of the fetus. These problems make it impossible for the baby to develop normally. They are often the result of random errors that occur early in the development of the baby, and are not passed from parent to child (not inherited). °· Infection of the cervix or uterus. °· Conditions that affect hormone balance in the body. °· Problems with the cervix, such as the cervix opening and thinning before pregnancy is at term (cervical insufficiency). °· Problems with the uterus. These may include: °? A uterus with an abnormal shape. °? Fibroids in the uterus. °? Congenital abnormalities. These are problems that were present at birth. °· Certain medical conditions. °· Smoking, drinking alcohol, or using drugs. °· Injury (trauma). °In many cases, the cause of a miscarriage is not known. °What are the signs or symptoms? °Symptoms of this condition include: °· Vaginal bleeding or spotting, with or without cramps or pain. °· Pain or cramping in the abdomen or lower back. °· Passing fluid, tissue, or blood clots from the vagina. °How is this diagnosed? °This  condition may be diagnosed based on: °· A physical exam. °· Ultrasound. °· Blood tests. °· Urine tests. °How is this treated? °Treatment for a miscarriage is sometimes not necessary if you naturally pass all the tissue that was in your uterus. If necessary, this condition may be treated with: °· Dilation and curettage (D&C). This is a procedure in which the cervix is stretched open and the lining of the uterus (endometrium) is scraped. This is done only if tissue from the fetus or placenta remains in the body (incomplete miscarriage). °· Medicines, such as: °? Antibiotic medicine, to treat infection. °? Medicine to help the body pass any remaining tissue. °? Medicine to reduce (contract) the size of the uterus. These medicines may be given if you have a lot of bleeding. °If you have Rh negative blood and your baby was Rh positive, you will need a shot of a medicine called Rh immunoglobulinto protect your future babies from Rh blood problems. "Rh-negative" and "Rh-positive" refer to whether or not the blood has a specific protein found on the surface of red blood cells (Rh factor). °Follow these instructions at home: °Medicines ° °· Take over-the-counter and prescription medicines only as told by your health care provider. °· If you were prescribed antibiotic medicine, take it as told by your health care provider. Do not stop taking the antibiotic even if you start to feel better. °· Do not take NSAIDs, such as aspirin and ibuprofen, unless they are approved by your health care provider. These   medicines can cause bleeding. °Activity °· Rest as directed. Ask your health care provider what activities are safe for you. °· Have someone help with home and family responsibilities during this time. °General instructions °· Keep track of the number of sanitary pads you use each day and how soaked (saturated) they are. Write down this information. °· Monitor the amount of tissue or blood clots that you pass from your vagina.  Save any large amounts of tissue for your health care provider to examine. °· Do not use tampons, douche, or have sex until your health care provider approves. °· To help you and your partner with the process of grieving, talk with your health care provider or seek counseling. °· When you are ready, meet with your health care provider to discuss any important steps you should take for your health. Also, discuss steps you should take to have a healthy pregnancy in the future. °· Keep all follow-up visits as told by your health care provider. This is important. °Where to find more information °· The American Congress of Obstetricians and Gynecologists: www.acog.org °· U.S. Department of Health and Human Services Office of Women’s Health: www.womenshealth.gov °Contact a health care provider if: °· You have a fever or chills. °· You have a foul smelling vaginal discharge. °· You have more bleeding instead of less. °Get help right away if: °· You have severe cramps or pain in your back or abdomen. °· You pass blood clots or tissue from your vagina that is walnut-sized or larger. °· You soak more than 1 regular sanitary pad in an hour. °· You become light-headed or weak. °· You pass out. °· You have feelings of sadness that take over your thoughts, or you have thoughts of hurting yourself. °Summary °· Most miscarriages happen in the first 3 months of pregnancy. Sometimes miscarriage happens before a woman even knows that she is pregnant. °· Follow your health care provider's instruction for home care. Keep all follow-up appointments. °· To help you and your partner with the process of grieving, talk with your health care provider or seek counseling. °This information is not intended to replace advice given to you by your health care provider. Make sure you discuss any questions you have with your health care provider. °Document Revised: 09/23/2018 Document Reviewed: 07/07/2016 °Elsevier Patient Education © 2020 Elsevier  Inc. ° °

## 2019-11-24 NOTE — MAU Note (Signed)
.   Meredith [name] is a 29 y.o. here in MAU reporting: Lower abdominal pain that started yesterday around 1200. She states that she had thin clear discharge yesterday but today at 1000 she started having bright red blood on her tissue when she wipes. Last intercourse was x2 weeks ago. Patient has a confirmed pregnancy at [facility].   Pain score: 2 Vitals:   11/24/19 1202  BP: 133/68  Pulse: 97  Resp: 15  Temp: 99.1 F (37.3 C)  SpO2: 100%      Lab orders placed from triage: UA

## 2019-11-27 ENCOUNTER — Other Ambulatory Visit: Payer: Self-pay | Admitting: Obstetrics & Gynecology

## 2019-11-27 ENCOUNTER — Encounter (HOSPITAL_COMMUNITY): Payer: Self-pay | Admitting: Obstetrics & Gynecology

## 2019-11-27 ENCOUNTER — Other Ambulatory Visit (HOSPITAL_COMMUNITY): Payer: Self-pay | Admitting: Obstetrics & Gynecology

## 2019-11-27 ENCOUNTER — Inpatient Hospital Stay (HOSPITAL_COMMUNITY)
Admission: AD | Admit: 2019-11-27 | Discharge: 2019-11-27 | Disposition: A | Discharge status: Home / Self Care | Payer: Self-pay | Attending: Obstetrics & Gynecology | Admitting: Obstetrics & Gynecology

## 2019-11-27 ENCOUNTER — Other Ambulatory Visit: Payer: Self-pay

## 2019-11-27 DIAGNOSIS — Z3A17 17 weeks gestation of pregnancy: Secondary | ICD-10-CM | POA: Insufficient documentation

## 2019-11-27 DIAGNOSIS — O021 Missed abortion: Secondary | ICD-10-CM

## 2019-11-27 DIAGNOSIS — Z87891 Personal history of nicotine dependence: Secondary | ICD-10-CM | POA: Insufficient documentation

## 2019-11-27 DIAGNOSIS — O039 Complete or unspecified spontaneous abortion without complication: Secondary | ICD-10-CM

## 2019-11-27 LAB — GC/CHLAMYDIA PROBE AMP (~~LOC~~) NOT AT ARMC
Chlamydia: NEGATIVE
Comment: NEGATIVE
Comment: NORMAL
Neisseria Gonorrhea: NEGATIVE

## 2019-11-27 LAB — TYPE AND SCREEN
ABO/RH(D): O POS
Antibody Screen: NEGATIVE

## 2019-11-27 LAB — CBC
HCT: 39 % (ref 36.0–46.0)
Hemoglobin: 12.6 g/dL (ref 12.0–15.0)
MCH: 27.4 pg (ref 26.0–34.0)
MCHC: 32.3 g/dL (ref 30.0–36.0)
MCV: 84.8 fL (ref 80.0–100.0)
Platelets: 244 10*3/uL (ref 150–400)
RBC: 4.6 MIL/uL (ref 3.87–5.11)
RDW: 13 % (ref 11.5–15.5)
WBC: 9.9 10*3/uL (ref 4.0–10.5)
nRBC: 0 % (ref 0.0–0.2)

## 2019-11-27 LAB — ABO/RH: ABO/RH(D): O POS

## 2019-11-27 MED ORDER — MISOPROSTOL 200 MCG PO TABS
800.0000 ug | ORAL_TABLET | Freq: Once | ORAL | Status: AC
  Administered 2019-11-27: 800 ug via ORAL
  Filled 2019-11-27: qty 4

## 2019-11-27 MED ORDER — LACTATED RINGERS IV SOLN: INTRAVENOUS | Status: DC

## 2019-11-27 MED ORDER — LACTATED RINGERS IV BOLUS
1000.0000 mL | Freq: Once | INTRAVENOUS | Status: AC
  Administered 2019-11-27: 1000 mL via INTRAVENOUS

## 2019-11-27 NOTE — MAU Note (Signed)
Pt able to ambulate around unit. She was able to ambulate off the unit with RN and significant other beside her without any dizziness.

## 2019-11-27 NOTE — MAU Note (Signed)
Pt presents via EMS with reports of sudden onset of abdominal cramping and heavy vaginal bleeding. Pt was told last week that baby did not have a heart beat and scheduled for D&E. On arrival patient noted to have a large amount of vaginal bleeding. CNM at bedside. IVFs started. Vital signs stable.

## 2019-11-27 NOTE — MAU Provider Note (Signed)
History     CSN: [id number]  Arrival date and time: 11/27/19 0202   Chief Complaint  Patient presents with  . Vaginal Bleeding   HPI Meredith [name] is a 29 y.o. [id number] at [redacted]w[redacted]d who presents via EMS for heavy vaginal bleeding. She was diagnosed with a 12 week missed AB on 6/11 and scheduled for D&E on 6/16. She states she started having cramping tonight and a sudden onset of heavy bleeding. She denies any pain at this time but is still reporting bleeding.  OB History    Gravida  7   Para  4   Term  3   Preterm      AB  2   Living  3     SAB  2   TAB      Ectopic      Multiple  0   Live Births  3        Obstetric Comments  5 month loss        Past Medical History:  Diagnosis Date  . COVID-19 06/08/2019   recovered at home per patient  . Medical history non-contributory     Past Surgical History:  Procedure Laterality Date  . CESAREAN SECTION  04/29/2011   Procedure: CESAREAN SECTION;  Surgeon: Roseanna Rainbow, MD;  Location: [facility];  Service: Gynecology;  Laterality: N/A;  Primary Cesarian Section    Family History  Problem Relation Age of Onset  . Cancer Maternal Grandmother   . Diabetes Neg Hx   . Hyperlipidemia Neg Hx   . Hypertension Neg Hx   . Kidney disease Neg Hx   . Stroke Neg Hx     Social History   Tobacco Use  . Smoking status: Former Smoker    Packs/day: 0.25    Types: Cigars  . Smokeless tobacco: Never Used  Vaping Use  . Vaping Use: Never used  Substance Use Topics  . Alcohol use: Not Currently    Comment: occ  . Drug use: No    Allergies: No Known Allergies  Medications Prior to Admission  Medication Sig Dispense Refill Last Dose  . metroNIDAZOLE (FLAGYL) 500 MG tablet Take 1 tablet (500 mg total) by mouth 2 (two) times daily. 14 tablet 0 11/26/2019 at Unknown time  . Prenatal Vit-Fe Fumarate-FA (PRENATAL MULTIVITAMIN) TABS tablet Take 1 tablet by mouth daily at 12 noon.   11/26/2019 at Unknown time     Review of Systems  Constitutional: Negative.  Negative for fatigue and fever.  HENT: Negative.   Respiratory: Negative.  Negative for shortness of breath.   Cardiovascular: Negative.  Negative for chest pain.  Gastrointestinal: Positive for abdominal pain. Negative for constipation, diarrhea, nausea and vomiting.  Genitourinary: Positive for vaginal bleeding. Negative for dysuria.  Neurological: Negative.  Negative for dizziness and headaches.   Physical Exam   Blood pressure 111/73, pulse 96, resp. rate 18, last menstrual period 07/26/2019, SpO2 100 %.  Physical Exam  Nursing note and vitals reviewed. Constitutional: She is oriented to person, place, and time. She appears well-developed. No distress.  HENT:  Head: Normocephalic.  Eyes: Pupils are equal, round, and reactive to light.  Cardiovascular: Normal rate, regular rhythm and normal heart sounds.  Respiratory: Effort normal and breath sounds normal. No respiratory distress.  GI: Soft. Bowel sounds are normal. She exhibits no distension. There is no abdominal tenderness.  Genitourinary:    Genitourinary Comments: Brisk bright red bleeding with POC in cervix   Neurological: She is  alert and oriented to person, place, and time.  Skin: Skin is warm and dry.  Psychiatric: Her behavior is normal. Judgment and thought content normal.    MAU Course  Procedures Results for orders placed or performed during the hospital encounter of 11/27/19 (from the past 24 hour(s))  CBC     Status: None   Collection Time: 11/27/19  2:28 AM  Result Value Ref Range   WBC 9.9 4.0 - 10.5 K/uL   RBC 4.60 3.87 - 5.11 MIL/uL   Hemoglobin 12.6 12.0 - 15.0 g/dL   HCT 39.0 36 - 46 %   MCV 84.8 80.0 - 100.0 fL   MCH 27.4 26.0 - 34.0 pg   MCHC 32.3 30.0 - 36.0 g/dL   RDW 13.0 11.5 - 15.5 %   Platelets 244 150 - 400 K/uL   nRBC 0.0 0.0 - 0.2 %  Type and screen [facility]     Status: None   Collection Time: 11/27/19  2:28 AM   Result Value Ref Range   ABO/RH(D) O POS    Antibody Screen NEG    Sample Expiration      11/30/2019,2359 Performed at [facility], [place] [address] [place], [place] [address]   ABO/Rh     Status: None (Preliminary result)   Collection Time: 11/27/19  2:28 AM  Result Value Ref Range   ABO/RH(D)      O POS Performed at [facility] [address] [place], [place] [address]    MDM With removal of pad after arrival, fetus and clots seen on pad. SSE performed and brisk bright red bleeding noted. After clearing clots and bleeding, some POCs able to be removed from cervix but not all.   IV LR bolus CBC, Type and Screen  Consulted with Dr. Elonda Husky- recommends cytotec for remaining POC. Patient agreeable to plan of care  Immediately after discussing plan of care, patient had vasovagal response and felt like she might vomit. Head of bed laid flat, VSS and patient reported feeling better after.   839mcg PO cytotec ordered After 2 hours, patient reporting intermittent pressure. Repeat SSE showed POC in cervix and vagina. Easily removed entire remaining POC intact. Patient reports resolution of pain. Denies any dizziness or lightheadedness. Able to ambulate around the room without difficulty.   EBL: 1178ml  Assessment and Plan   1. SAB (spontaneous abortion)   2. Fetal demise at [redacted] weeks gestation    -Discharge home in stable condition -Vaginal bleeding and pain precautions discussed -Patient advised to follow-up with [facility] in 1 week for SAB follow up, message sent  -Patient may return to MAU as needed or if her condition were to change or worsen   [place] 11/27/2019, 3:46 AM

## 2019-11-27 NOTE — Discharge Instructions (Signed)

## 2019-11-28 LAB — SURGICAL PATHOLOGY

## 2019-11-29 ENCOUNTER — Ambulatory Visit (HOSPITAL_COMMUNITY)
Admission: RE | Admit: 2019-11-29 | Discharge: 2019-11-29 | Disposition: A | Discharge status: Home / Self Care | Payer: Self-pay | Source: Ambulatory Visit | Attending: Obstetrics & Gynecology | Admitting: Obstetrics & Gynecology

## 2019-11-29 ENCOUNTER — Ambulatory Visit (HOSPITAL_BASED_OUTPATIENT_CLINIC_OR_DEPARTMENT_OTHER): Admission: RE | Admit: 2019-11-29 | Payer: Self-pay | Source: Home / Self Care | Admitting: Obstetrics & Gynecology

## 2019-11-29 DIAGNOSIS — O021 Missed abortion: Secondary | ICD-10-CM

## 2019-11-29 SURGERY — DILATION AND EVACUATION, UTERUS
Anesthesia: Choice

## 2019-12-12 ENCOUNTER — Encounter: Payer: Self-pay | Admitting: *Deleted

## 2019-12-13 ENCOUNTER — Ambulatory Visit: Payer: Self-pay | Admitting: Family Medicine

## 2020-12-19 LAB — LAB REPORT - SCANNED: Pap: NEGATIVE

## 2021-11-11 ENCOUNTER — Encounter (HOSPITAL_COMMUNITY): Payer: Self-pay | Admitting: *Deleted

## 2021-11-11 ENCOUNTER — Inpatient Hospital Stay (HOSPITAL_COMMUNITY)
Admission: AD | Admit: 2021-11-11 | Discharge: 2021-11-11 | Disposition: A | Discharge status: Home / Self Care | Payer: Self-pay | Attending: Obstetrics & Gynecology | Admitting: Obstetrics & Gynecology

## 2021-11-11 ENCOUNTER — Inpatient Hospital Stay (HOSPITAL_BASED_OUTPATIENT_CLINIC_OR_DEPARTMENT_OTHER): Payer: Self-pay

## 2021-11-11 ENCOUNTER — Other Ambulatory Visit: Payer: Self-pay

## 2021-11-11 DIAGNOSIS — O09293 Supervision of pregnancy with other poor reproductive or obstetric history, third trimester: Secondary | ICD-10-CM | POA: Insufficient documentation

## 2021-11-11 DIAGNOSIS — O403XX0 Polyhydramnios, third trimester, not applicable or unspecified: Secondary | ICD-10-CM

## 2021-11-11 DIAGNOSIS — R7309 Other abnormal glucose: Secondary | ICD-10-CM

## 2021-11-11 DIAGNOSIS — O0933 Supervision of pregnancy with insufficient antenatal care, third trimester: Secondary | ICD-10-CM

## 2021-11-11 DIAGNOSIS — O99353 Diseases of the nervous system complicating pregnancy, third trimester: Secondary | ICD-10-CM | POA: Insufficient documentation

## 2021-11-11 DIAGNOSIS — Z8759 Personal history of other complications of pregnancy, childbirth and the puerperium: Secondary | ICD-10-CM

## 2021-11-11 DIAGNOSIS — G5600 Carpal tunnel syndrome, unspecified upper limb: Secondary | ICD-10-CM

## 2021-11-11 DIAGNOSIS — O409XX0 Polyhydramnios, unspecified trimester, not applicable or unspecified: Secondary | ICD-10-CM

## 2021-11-11 DIAGNOSIS — Z3A31 31 weeks gestation of pregnancy: Secondary | ICD-10-CM

## 2021-11-11 DIAGNOSIS — O0930 Supervision of pregnancy with insufficient antenatal care, unspecified trimester: Secondary | ICD-10-CM

## 2021-11-11 DIAGNOSIS — O99213 Obesity complicating pregnancy, third trimester: Secondary | ICD-10-CM

## 2021-11-11 DIAGNOSIS — O34219 Maternal care for unspecified type scar from previous cesarean delivery: Secondary | ICD-10-CM | POA: Insufficient documentation

## 2021-11-11 DIAGNOSIS — O321XX0 Maternal care for breech presentation, not applicable or unspecified: Secondary | ICD-10-CM

## 2021-11-11 DIAGNOSIS — Z98891 History of uterine scar from previous surgery: Secondary | ICD-10-CM

## 2021-11-11 DIAGNOSIS — O09299 Supervision of pregnancy with other poor reproductive or obstetric history, unspecified trimester: Secondary | ICD-10-CM

## 2021-11-11 DIAGNOSIS — O26899 Other specified pregnancy related conditions, unspecified trimester: Secondary | ICD-10-CM

## 2021-11-11 HISTORY — DX: Personal history of other complications of pregnancy, childbirth and the puerperium: Z87.59

## 2021-11-11 LAB — DIFFERENTIAL
Abs Immature Granulocytes: 0.08 10*3/uL — ABNORMAL HIGH (ref 0.00–0.07)
Basophils Absolute: 0 10*3/uL (ref 0.0–0.1)
Basophils Relative: 0 %
Eosinophils Absolute: 0.1 10*3/uL (ref 0.0–0.5)
Eosinophils Relative: 1 %
Immature Granulocytes: 1 %
Lymphocytes Relative: 17 %
Lymphs Abs: 2 10*3/uL (ref 0.7–4.0)
Monocytes Absolute: 0.6 10*3/uL (ref 0.1–1.0)
Monocytes Relative: 5 %
Neutro Abs: 8.9 10*3/uL — ABNORMAL HIGH (ref 1.7–7.7)
Neutrophils Relative %: 76 %

## 2021-11-11 LAB — TYPE AND SCREEN
ABO/RH(D): O POS
Antibody Screen: NEGATIVE

## 2021-11-11 LAB — URINALYSIS, ROUTINE W REFLEX MICROSCOPIC
Bilirubin Urine: NEGATIVE
Glucose, UA: 50 mg/dL — AB
Hgb urine dipstick: NEGATIVE
Ketones, ur: NEGATIVE mg/dL
Nitrite: NEGATIVE
Protein, ur: NEGATIVE mg/dL
Specific Gravity, Urine: 1.024 (ref 1.005–1.030)
pH: 5 (ref 5.0–8.0)

## 2021-11-11 LAB — RPR: RPR Ser Ql: NONREACTIVE

## 2021-11-11 LAB — COMPREHENSIVE METABOLIC PANEL
ALT: 9 U/L (ref 0–44)
AST: 15 U/L (ref 15–41)
Albumin: 2.7 g/dL — ABNORMAL LOW (ref 3.5–5.0)
Alkaline Phosphatase: 100 U/L (ref 38–126)
Anion gap: 6 (ref 5–15)
BUN: <5 mg/dL — ABNORMAL LOW (ref 6–20)
CO2: 20 mmol/L — ABNORMAL LOW (ref 22–32)
Calcium: 8.7 mg/dL — ABNORMAL LOW (ref 8.9–10.3)
Chloride: 109 mmol/L (ref 98–111)
Creatinine, Ser: 0.52 mg/dL (ref 0.44–1.00)
GFR, Estimated: >60 mL/min (ref >60)
Glucose, Bld: 122 mg/dL — ABNORMAL HIGH (ref 70–99)
Potassium: 3.4 mmol/L — ABNORMAL LOW (ref 3.5–5.1)
Sodium: 135 mmol/L (ref 135–145)
Total Bilirubin: <0.1 mg/dL — ABNORMAL LOW (ref 0.3–1.2)
Total Protein: 6.9 g/dL (ref 6.5–8.1)

## 2021-11-11 LAB — HEMOGLOBIN A1C
Hgb A1c MFr Bld: 6.1 % — ABNORMAL HIGH (ref 4.8–5.6)
Mean Plasma Glucose: 128.37 mg/dL

## 2021-11-11 LAB — CBC
HCT: 33.2 % — ABNORMAL LOW (ref 36.0–46.0)
Hemoglobin: 10.6 g/dL — ABNORMAL LOW (ref 12.0–15.0)
MCH: 25.4 pg — ABNORMAL LOW (ref 26.0–34.0)
MCHC: 31.9 g/dL (ref 30.0–36.0)
MCV: 79.6 fL — ABNORMAL LOW (ref 80.0–100.0)
Platelets: 252 10*3/uL (ref 150–400)
RBC: 4.17 MIL/uL (ref 3.87–5.11)
RDW: 14.1 % (ref 11.5–15.5)
WBC: 11.7 10*3/uL — ABNORMAL HIGH (ref 4.0–10.5)
nRBC: 0 % (ref 0.0–0.2)

## 2021-11-11 LAB — PROTEIN / CREATININE RATIO, URINE
Creatinine, Urine: 229.25 mg/dL
Protein Creatinine Ratio: 0.14 mg/mg{Cre} (ref 0.00–0.15)
Total Protein, Urine: 31 mg/dL

## 2021-11-11 LAB — HEPATITIS B SURFACE ANTIGEN: Hepatitis B Surface Ag: NONREACTIVE

## 2021-11-11 LAB — HIV ANTIBODY (ROUTINE TESTING W REFLEX): HIV Screen 4th Generation wRfx: NONREACTIVE

## 2021-11-11 LAB — POCT PREGNANCY, URINE: Preg Test, Ur: POSITIVE — AB

## 2021-11-11 NOTE — Progress Notes (Signed)
Orthopedic Tech Progress Note Patient Details:  Meredith [name] 1991/02/05 [id number]  Ortho Devices Type of Ortho Device: Velcro wrist splint Ortho Device/Splint Location: BUE Ortho Device/Splint Interventions: Ordered, Application, Adjustment   Post Interventions Patient Tolerated: Well Instructions Provided: Care of device  Donald Pore 11/11/2021, 11:31 AM

## 2021-11-11 NOTE — MAU Note (Addendum)
Aricka Caughell is a 31 y.o.  here in MAU with unknown gestational age reporting: numbness in right hand and possible high blood pressure.  States has been online researching symptoms and thinks she may have high BP secondary stress. Reports no PNC with the pregnancy because awaiting Medicaid since returning to the [place] from [place].  Reports she's approximately 7 months pregnant.  Endorses +FM.  Denies LOF or VB. LMP: possibly November 2022 Onset of complaint: last Friday Pain score: 0 There were no vitals filed for this visit.   FHT:145 bpm Lab orders placed from triage:   UPT/UA

## 2021-11-11 NOTE — MAU Provider Note (Signed)
History     CSN: [id number]  Arrival date and time: 11/11/21 4098   Event Date/Time   First Provider Initiated Contact with Patient 11/11/21 1036      Chief Complaint  Patient presents with   Numbness in right hand   BP Evaluation   HPI  Meredith [name] is a 31 y.o. [id number] at Unknown gestational age who presents for evaluation of right hand numbness. Patient reports she has intermittent numbness and tingling in her right hand, usually at night. Denies any at this time. She reprots she recently moved back from [place] and has not started any prenatal care. She denies any other complaints. Denies pain. She denies any vaginal bleeding, discharge, and leaking of fluid. Denies any constipation, diarrhea or any urinary complaints. Reports normal fetal movement.   OB History     Gravida  8   Para  4   Term  3   Preterm      AB  3   Living  3      SAB  3   IAB      Ectopic      Multiple  0   Live Births  3        Obstetric Comments  5 month loss  02/26/18 preg is listed as a "para" in error, this should be an SAB.... IUFD at 15wk; unable to change in computer With first preg, was induced for high blood pressure,? Reason for c/s - did get to compete and pushed         Past Medical History:  Diagnosis Date   COVID-19 06/08/2019   recovered at home per patient   Medical history non-contributory     Past Surgical History:  Procedure Laterality Date   CESAREAN SECTION  04/29/2011   Procedure: CESAREAN SECTION;  Surgeon: Roseanna Rainbow, MD;  Location: [facility];  Service: Gynecology;  Laterality: N/A;  Primary Cesarian Section    Family History  Problem Relation Age of Onset   Cancer Maternal Grandmother    Diabetes Neg Hx    Hyperlipidemia Neg Hx    Hypertension Neg Hx    Kidney disease Neg Hx    Stroke Neg Hx     Social History   Tobacco Use   Smoking status: Former    Packs/day: 0.25    Types: Cigars, Cigarettes   Smokeless tobacco: Never   Vaping Use   Vaping Use: Never used  Substance Use Topics   Alcohol use: Not Currently    Comment: occ   Drug use: No    Allergies: No Known Allergies  Medications Prior to Admission  Medication Sig Dispense Refill Last Dose   metroNIDAZOLE (FLAGYL) 500 MG tablet Take 1 tablet (500 mg total) by mouth 2 (two) times daily. 14 tablet 0    Prenatal Vit-Fe Fumarate-FA (PRENATAL MULTIVITAMIN) TABS tablet Take 1 tablet by mouth daily at 12 noon.       Review of Systems  Constitutional: Negative.  Negative for fatigue and fever.  HENT: Negative.    Respiratory: Negative.  Negative for shortness of breath.   Cardiovascular: Negative.  Negative for chest pain.  Gastrointestinal: Negative.  Negative for abdominal pain, constipation, diarrhea, nausea and vomiting.  Genitourinary: Negative.  Negative for dysuria, vaginal bleeding and vaginal discharge.  Musculoskeletal:        Wrist pain  Neurological: Negative.  Negative for dizziness and headaches.  Physical Exam   Blood pressure 130/74, pulse 88, temperature 98.4 F (36.9 C),  temperature source Oral, height  (1.6 m), weight 118.4 kg, SpO2 100 %, unknown if currently breastfeeding.  Patient Vitals for the past 24 hrs:  BP Temp Temp src Pulse SpO2 Height Weight  11/11/21 1512 130/74 -- -- 88 -- -- --  11/11/21 1231 125/72 -- -- 99 -- -- --  11/11/21 1216 130/69 -- -- 97 -- -- --  11/11/21 1201 120/62 -- -- (!) 105 -- -- --  11/11/21 1200 -- -- -- -- 100 % -- --  11/11/21 1146 129/70 -- -- (!) 101 -- -- --  11/11/21 1131 130/69 -- -- (!) 107 -- -- --  11/11/21 1116 137/74 -- -- (!) 112 -- -- --  11/11/21 1100 138/68 -- -- (!) 107 100 % -- --  11/11/21 1047 (!) 145/76 -- -- (!) 109 -- -- --  11/11/21 1045 -- -- -- -- 99 % -- --  11/11/21 1011 129/75 98.4 F (36.9 C) Oral (!) 124 100 % -- --  11/11/21 1004 -- -- -- -- --  (1.6 m) 118.4 kg    Physical Exam Vitals and nursing note reviewed.  Constitutional:       General: She is not in acute distress.    Appearance: She is well-developed.  HENT:     Head: Normocephalic.  Eyes:     Pupils: Pupils are equal, round, and reactive to light.  Cardiovascular:     Rate and Rhythm: Normal rate and regular rhythm.     Heart sounds: Normal heart sounds.  Pulmonary:     Effort: Pulmonary effort is normal. No respiratory distress.     Breath sounds: Normal breath sounds.  Abdominal:     General: Bowel sounds are normal. There is no distension.     Palpations: Abdomen is soft.     Tenderness: There is no abdominal tenderness.  Skin:    General: Skin is warm and dry.  Neurological:     Mental Status: She is alert and oriented to person, place, and time.  Psychiatric:        Mood and Affect: Mood normal.        Behavior: Behavior normal.        Thought Content: Thought content normal.        Judgment: Judgment normal.   Fetal Tracing:  Baseline: 140 Variability: moderate Accels: 15x15 Decels: none  Toco: none   MAU Course  Procedures  Results for orders placed or performed during the hospital encounter of 11/11/21 (from the past 24 hour(s))  Pregnancy, urine POC     Status: Abnormal   Collection Time: 11/11/21 10:00 AM  Result Value Ref Range   Preg Test, Ur POSITIVE (A) NEGATIVE  Urinalysis, Routine w reflex microscopic     Status: Abnormal   Collection Time: 11/11/21 10:03 AM  Result Value Ref Range   Color, Urine YELLOW YELLOW   APPearance CLOUDY (A) CLEAR   Specific Gravity, Urine 1.024 1.005 - 1.030   pH 5.0 5.0 - 8.0   Glucose, UA 50 (A) NEGATIVE mg/dL   Hgb urine dipstick NEGATIVE NEGATIVE   Bilirubin Urine NEGATIVE NEGATIVE   Ketones, ur NEGATIVE NEGATIVE mg/dL   Protein, ur NEGATIVE NEGATIVE mg/dL   Nitrite NEGATIVE NEGATIVE   Leukocytes,Ua MODERATE (A) NEGATIVE   RBC / HPF 6-10 0 - 5 RBC/hpf   WBC, UA 11-20 0 - 5 WBC/hpf   Bacteria, UA FEW (A) NONE SEEN   Squamous Epithelial / LPF 21-50 0 - 5  Mucus PRESENT    Ca  Oxalate Crys, UA PRESENT   Protein / creatinine ratio, urine     Status: None   Collection Time: 11/11/21 10:03 AM  Result Value Ref Range   Creatinine, Urine 229.25 mg/dL   Total Protein, Urine 31 mg/dL   Protein Creatinine Ratio 0.14 0.00 - 0.15 mg/mg[Cre]  Hepatitis B surface antigen     Status: None   Collection Time: 11/11/21 11:08 AM  Result Value Ref Range   Hepatitis B Surface Ag NON REACTIVE NON REACTIVE  RPR     Status: None   Collection Time: 11/11/21 11:08 AM  Result Value Ref Range   RPR Ser Ql NON REACTIVE NON REACTIVE  CBC     Status: Abnormal   Collection Time: 11/11/21 11:08 AM  Result Value Ref Range   WBC 11.7 (H) 4.0 - 10.5 K/uL   RBC 4.17 3.87 - 5.11 MIL/uL   Hemoglobin 10.6 (L) 12.0 - 15.0 g/dL   HCT 16.1 (L) 09.6 - 04.5 %   MCV 79.6 (L) 80.0 - 100.0 fL   MCH 25.4 (L) 26.0 - 34.0 pg   MCHC 31.9 30.0 - 36.0 g/dL   RDW 40.9 81.1 - 91.4 %   Platelets 252 150 - 400 K/uL   nRBC 0.0 0.0 - 0.2 %  Differential     Status: Abnormal   Collection Time: 11/11/21 11:08 AM  Result Value Ref Range   Neutrophils Relative % 76 %   Neutro Abs 8.9 (H) 1.7 - 7.7 K/uL   Lymphocytes Relative 17 %   Lymphs Abs 2.0 0.7 - 4.0 K/uL   Monocytes Relative 5 %   Monocytes Absolute 0.6 0.1 - 1.0 K/uL   Eosinophils Relative 1 %   Eosinophils Absolute 0.1 0.0 - 0.5 K/uL   Basophils Relative 0 %   Basophils Absolute 0.0 0.0 - 0.1 K/uL   Immature Granulocytes 1 %   Abs Immature Granulocytes 0.08 (H) 0.00 - 0.07 K/uL  Type and screen [facility]     Status: None   Collection Time: 11/11/21 11:08 AM  Result Value Ref Range   ABO/RH(D) O POS    Antibody Screen NEG    Sample Expiration      11/14/2021,2359 Performed at [facility], [address], [place], [place] [address]   HIV Antibody (routine testing w rflx)     Status: None   Collection Time: 11/11/21 11:08 AM  Result Value Ref Range   HIV Screen 4th Generation wRfx Non Reactive Non Reactive   Hemoglobin A1c     Status: Abnormal   Collection Time: 11/11/21 11:08 AM  Result Value Ref Range   Hgb A1c MFr Bld 6.1 (H) 4.8 - 5.6 %   Mean Plasma Glucose 128.37 mg/dL  Comprehensive metabolic panel     Status: Abnormal   Collection Time: 11/11/21 11:08 AM  Result Value Ref Range   Sodium 135 135 - 145 mmol/L   Potassium 3.4 (L) 3.5 - 5.1 mmol/L   Chloride 109 98 - 111 mmol/L   CO2 20 (L) 22 - 32 mmol/L   Glucose, Bld 122 (H) 70 - 99 mg/dL   BUN <5 (L) 6 - 20 mg/dL   Creatinine, Ser 6.21 0.44 - 1.00 mg/dL   Calcium 8.7 (L) 8.9 - 10.3 mg/dL   Total Protein 6.9 6.5 - 8.1 g/dL   Albumin 2.7 (L) 3.5 - 5.0 g/dL   AST 15 15 - 41 U/L  ALT 9 0 - 44 U/L   Alkaline Phosphatase 100 38 - 126 U/L   Total Bilirubin <0.1 (L) 0.3 - 1.2 mg/dL   GFR, Estimated >16 >10 mL/min   Anion gap 6 5 - 15     [place] MFM FETAL BPP WO NON STRESS  Result Date: 11/11/2021 ----------------------------------------------------------------------  OBSTETRICS REPORT                       (Signed Final 11/11/2021 03:30 pm) ---------------------------------------------------------------------- Patient Info  ID #:       [id number]                          D.O.B.:  April 15, 1991 (30 yrs)  Name:       Meredith [name]                  Visit Date: 11/11/2021 10:51 am ---------------------------------------------------------------------- Performed By  Attending:        Noralee Space MD        Ref. Address:     Faculty  Performed By:     Marcellina Millin       [facility]  Referred By:      Rolm Bookbinder CNM ---------------------------------------------------------------------- Orders  #  Description                           Code        Ordered By  1  [place] MFM OB COMP + 14 WK                76805.01    Kevyn Boquet  2  [place] MFM FETAL BPP WO NON               76819.01    RAVI [facility]     STRESS  ----------------------------------------------------------------------  #  Order #                     Accession #                Episode #  1  [id number]                   [id number]                 [id number]  2  [id number]                   [id number]                 [id number] ---------------------------------------------------------------------- Indications  Insufficient Prenatal Care                     O09.30  Polyhydramnios, third trimester, antepartum    O40.3XX0  condition or complication, unspecified fetus  Obesity complicating pregnancy, third          O99.213  trimester  Poor obstetric  history: Previous midtrimester  O09.299  loss (15wks)  [redacted] weeks gestation of pregnancy                Z3A.31 ---------------------------------------------------------------------- Vital Signs  BP:          125/72 ---------------------------------------------------------------------- Fetal Evaluation  Num Of Fetuses:         1  Fetal Heart Rate(bpm):  138  Cardiac Activity:       Observed  Presentation:           Breech  Placenta:               Posterior  P. Cord Insertion:      Not well visualized  Amniotic Fluid  AFI FV:      Polyhydramnios  AFI Sum(cm)     %Tile       Largest Pocket(cm)  27.9            > 97        9.4  RUQ(cm)       RLQ(cm)       LUQ(cm)        LLQ(cm)  9.4           6.2           5.6            6.7 ---------------------------------------------------------------------- Biophysical Evaluation  Amniotic F.V:   Pocket => 2 cm             F. Tone:        Observed  F. Movement:    Observed                   Score:          6/8  F. Breathing:   Not Observed ---------------------------------------------------------------------- Biometry  BPD:      78.2  mm     G. Age:  31w 3d         26  %    CI:        69.99   %    70 - 86                                                          FL/HC:      19.7   %    19.1 - 21.3  HC:      298.2  mm     G. Age:  33w 0d         44  %    HC/AC:      1.06        0.96 - 1.17  AC:       281.4  mm     G. Age:  32w 1d         58  %    FL/BPD:     74.9   %    71 - 87  FL:       58.6  mm     G. Age:  30w 4d         10  %    FL/AC:      20.8   %    20 - 24  LV:        6.2  mm  Est. FW:  1824  gm           4 lb     34  % ---------------------------------------------------------------------- OB History  Gravidity:    8         Term:   3        Prem:   0        SAB:   4  TOP:          0       Ectopic:  0        Living: 3 ---------------------------------------------------------------------- Gestational Age  U/S Today:     31w 6d                                        EDD:   01/07/22  Best:          31w 6d     Det. By:  U/S (11/11/21)           EDD:   01/07/22 ---------------------------------------------------------------------- Anatomy  Cranium:               Appears normal         Stomach:                Appears normal, left                                                                        sided  Cavum:                 Appears normal         Abdomen:                Appears normal  Ventricles:            Appears normal         Abdominal Wall:         Appears nml (cord                                                                        insert, abd wall)  Cerebellum:            Appears normal         Cord Vessels:           Appears normal (3                                                                        vessel cord)  Posterior Fossa:       Appears normal         Kidneys:  Appear normal  Lips:                  Appears normal         Bladder:                Appears normal  Aortic Arch:           Appears normal         Spine:                  Ltd views no                                                                        intracranial signs of                                                                        NTD  Diaphragm:             Appears normal  Other:  Technically difficult [name] to advanced GA and fetal position.  ---------------------------------------------------------------------- Cervix Uterus Adnexa  Cervix  Length:              5  cm.  Normal appearance by transabdominal scan.  Uterus  No abnormality visualized. ---------------------------------------------------------------------- Impression  Patient with unknown gestational age was evaluated at the  MAU for possible hypertension and numbness of the hands.  Blood pressure at MAU was 125/72 mmHg.  On today's ultrasound, fetal biometry is consistent with 31  weeks and 6 days gestation.  Mild polyhydramnios is seen  (AFI 28 cm).  Fetal anatomical survey appears normal but  limited by advanced gestational age.  Fetal breathing  movements did not meet the criteria of BPP. Breech  presentation.  BPP 6/8.  We have assigned her EDD at 01/07/2022 based on today's  ultrasound measurements. ----------------------------------------------------------------------                 Noralee Spaceavi Shankar, MD Electronically Signed Final Report   11/11/2021 03:30 pm ----------------------------------------------------------------------  US MFM OB Comp + 14 Weeks  Result Date: 11/11/2021 ----------------------------------------------------------------------  OBSTETRICS REPORT                       (Signed Final 11/11/2021 03:30 pm) ---------------------------------------------------------------------- Patient Info  ID #:       [id number]                          D.O.B.:  1990-08-12 (30 yrs)  Name:       Meredith DueEMILY [name]                  Visit Date: 11/11/2021 10:51 am ---------------------------------------------------------------------- Performed By  Attending:        Noralee Spaceavi Shankar MD        Ref. Address:     Faculty  Performed By:     Marcellina MillinKatherine Speake       [facility]  Referred By:      Rolm Bookbinder CNM ---------------------------------------------------------------------- Orders  #   Description                           Code        Ordered By  1  [place] MFM OB COMP + 14 WK                76805.01    Cecilie Heidel  2  [place] MFM FETAL BPP WO NON               76819.01    RAVI [facility]     STRESS ----------------------------------------------------------------------  #  Order #                     Accession #                Episode #  1  [id number]                   [id number]                 [id number]  2  [id number]                   [id number]                 [id number] ---------------------------------------------------------------------- Indications  Insufficient Prenatal Care                     O09.30  Polyhydramnios, third trimester, antepartum    O40.3XX0  condition or complication, unspecified fetus  Obesity complicating pregnancy, third          O99.213  trimester  Poor obstetric history: Previous midtrimester  O09.299  loss (15wks)  [redacted] weeks gestation of pregnancy                Z3A.31 ---------------------------------------------------------------------- Vital Signs  BP:          125/72 ---------------------------------------------------------------------- Fetal Evaluation  Num Of Fetuses:         1  Fetal Heart Rate(bpm):  138  Cardiac Activity:       Observed  Presentation:           Breech  Placenta:               Posterior  P. Cord Insertion:      Not well visualized  Amniotic Fluid  AFI FV:      Polyhydramnios  AFI Sum(cm)     %Tile       Largest Pocket(cm)  27.9            > 97        9.4  RUQ(cm)       RLQ(cm)       LUQ(cm)        LLQ(cm)  9.4           6.2  5.6            6.7 ---------------------------------------------------------------------- Biophysical Evaluation  Amniotic F.V:   Pocket => 2 cm             F. Tone:        Observed  F. Movement:    Observed                   Score:          6/8  F. Breathing:   Not Observed ---------------------------------------------------------------------- Biometry  BPD:      78.2  mm     G. Age:  31w 3d         26  %    CI:         69.99   %    70 - 86                                                          FL/HC:      19.7   %    19.1 - 21.3  HC:      298.2  mm     G. Age:  33w 0d         44  %    HC/AC:      1.06        0.96 - 1.17  AC:      281.4  mm     G. Age:  32w 1d         58  %    FL/BPD:     74.9   %    71 - 87  FL:       58.6  mm     G. Age:  30w 4d         10  %    FL/AC:      20.8   %    20 - 24  LV:        6.2  mm  Est. FW:    1824  gm           4 lb     34  % ---------------------------------------------------------------------- OB History  Gravidity:    8         Term:   3        Prem:   0        SAB:   4  TOP:          0       Ectopic:  0        Living: 3 ---------------------------------------------------------------------- Gestational Age  U/S Today:     31w 6d                                        EDD:   01/07/22  Best:          31w 6d     Det. By:  U/S (11/11/21)           EDD:   01/07/22 ---------------------------------------------------------------------- Anatomy  Cranium:               Appears normal         Stomach:  Appears normal, left                                                                        sided  Cavum:                 Appears normal         Abdomen:                Appears normal  Ventricles:            Appears normal         Abdominal Wall:         Appears nml (cord                                                                        insert, abd wall)  Cerebellum:            Appears normal         Cord Vessels:           Appears normal (3                                                                        vessel cord)  Posterior Fossa:       Appears normal         Kidneys:                Appear normal  Lips:                  Appears normal         Bladder:                Appears normal  Aortic Arch:           Appears normal         Spine:                  Ltd views no                                                                        intracranial signs of  NTD  Diaphragm:             Appears normal  Other:  Technically difficult [name] to advanced GA and fetal position. ---------------------------------------------------------------------- Cervix Uterus Adnexa  Cervix  Length:              5  cm.  Normal appearance by transabdominal scan.  Uterus  No abnormality visualized. ---------------------------------------------------------------------- Impression  Patient with unknown gestational age was evaluated at the  MAU for possible hypertension and numbness of the hands.  Blood pressure at MAU was 125/72 mmHg.  On today's ultrasound, fetal biometry is consistent with 31  weeks and 6 days gestation.  Mild polyhydramnios is seen  (AFI 28 cm).  Fetal anatomical survey appears normal but  limited by advanced gestational age.  Fetal breathing  movements did not meet the criteria of BPP. Breech  presentation.  BPP 6/8.  We have assigned her EDD at 01/07/2022 based on today's  ultrasound measurements. ----------------------------------------------------------------------                 Noralee Space, MD Electronically Signed Final Report   11/11/2021 03:30 pm ----------------------------------------------------------------------    MDM Given fundal height of 32 cm, consistent with approximate LMP, will obtain OB labs and ultrasound while in MAU Labs ordered and reviewed.   Wrist splints ordered through OT tech  OB Panel Preeclampsia labs UA, UC [place] MFM OB Comp +14 weeks  CNM independently reviewed the imaging ordered. Imaging show approximately [redacted] week gestational age MFM BPP added to exam per MFM recommendation  CNM consulted with Dr. Charlotta Newton regarding presentation and results- MD recommends urgent appointment in office this week for twice weekly testing. Reassured with 8/10 testing at this gestational age  Urgent message sent to [facility] to schedule new OB ASAP as well as NST this week  Assessment and Plan   1. Carpal  tunnel syndrome during pregnancy   2. No prenatal care in current pregnancy in third trimester   3. [redacted] weeks gestation of pregnancy   4. Polyhydramnios in third trimester complication, single or unspecified fetus   5. Breech presentation, single or unspecified fetus   6. Elevated hemoglobin A1c   7. History of cesarean section     -Discharge home in stable condition -Third trimester precautions discussed -Patient advised to [facility] with [facility] as soon as possible for New OB -Patient may return to MAU as needed or if her condition were to change or worsen  Rolm Bookbinder, CNM 11/11/2021, 10:36 AM

## 2021-11-11 NOTE — Discharge Instructions (Signed)

## 2021-11-12 LAB — CULTURE, OB URINE: Culture: 30000 — AB

## 2021-11-13 ENCOUNTER — Other Ambulatory Visit: Payer: Self-pay

## 2021-11-13 LAB — RUBELLA SCREEN: Rubella: <0.9 index — ABNORMAL LOW (ref >0.99)

## 2021-11-14 DIAGNOSIS — O0990 Supervision of high risk pregnancy, unspecified, unspecified trimester: Secondary | ICD-10-CM | POA: Insufficient documentation

## 2021-11-14 NOTE — Progress Notes (Signed)
PRENATAL VISIT NOTE  Subjective:  Meredith [name] is a 31 y.o. [phone] at [redacted]w[redacted]d being seen today for initial prenatal care.  She is currently has the following issues for this high-risk pregnancy and has Hx of cesarean section complicating pregnancy; Fetal demise at [redacted] weeks gestation; Hx of preeclampsia, prior pregnancy, currently pregnant; Polyhydramnios; Breech presentation; Supervision of high risk pregnancy, antepartum; and Obesity in pregnancy, antepartum on their problem list.  Patient reports no complaints.  Contractions: Not present. Vag. Bleeding: None.  Movement: Present. Denies leaking of fluid.   The following portions of the patient's history were reviewed and updated as appropriate: allergies, current medications, past family history, past medical history, past social history, past surgical history and problem list.   Objective:   Vitals:   11/18/21 0921  BP: (!) 118/53  Pulse: 100  Weight: 261 lb 8 oz (118.6 kg)    Fetal Status: Fetal Heart Rate (bpm): 152 Fundal Height: 33 cm Movement: Present     General:  Alert, oriented and cooperative. Patient is in no acute distress.  Skin: Skin is warm and dry. No rash noted.   Cardiovascular: Normal heart rate noted  Respiratory: Normal respiratory effort, no problems with respiration noted  Abdomen: Soft, gravid, appropriate for gestational age.  Pain/Pressure: Present     Pelvic: Cervical exam deferred        Extremities: Normal range of motion.  Edema: Trace  Mental Status: Normal mood and affect. Normal behavior. Normal judgment and thought content.   Assessment and Plan:  Pregnancy: [id number] at [redacted]w[redacted]d 1. Hx of cesarean section complicating pregnancy - Reviewed TOLAC consent if baby is not breech.  - We discussed her history of c-section. Her previous c-section was due to  arrest of descent after pushing for 2 hr. It was 10 years ago. It was by Dr. Gaynell Face - head had been crowning.  She has a history of successful  vaginal delivery - We discussed the risks associated with repeat c-section: bleeding, infection, injury to surrounding organs/tissues I.e. bowel/bladder, development of scar tissue, wound complications such as wound separation or infection, need for additional surgery, percreta/acreta - We discussed the risks associated with TOLAC: risk of it being unsuccessful, specially in the context of her history, the risks in general of a vaginal delivery (prolapse, SUI, differences in recovery, pelvic floor dysfunction, etc), and the risk of uterine rupture. We discussed with the risk of uterine rupture that while rare it is not easily predicted, that it is a surgical emergency, and it can be potentially catastrophic for mom and baby. We discussed if uterine rupture that it may necessitate hysterectomy if the rupture caused issues with bleeding that could not be managed with other surgical options.  - After counseling, the patient was given the opportunity to ask questions and all questions answered.  - After considering her options, she would like to [facility] - Information provided to the patient   2. Hx of preeclampsia, prior pregnancy, currently pregnant - Too late for ldASA - Reviewed risk of recurrence  3. Breech presentation, single or unspecified fetus - Will recheck with serial growth given polyhydramnios  4. Polyhydramnios in third trimester complication, single or unspecified fetus - Growth normal - 34%ile, normal AC. AFI was 27.9.  - Will continue to follow with repeat US on or after 6/20   5. Supervision of high risk pregnancy, antepartum - Offered and recommended tdap today - self-pay - Had pap in [place] in July - reports it was normal.  She will send [place] a picture of the pap smear (has it in her email - A1C was 6.1 in MAU. Needs GTT asap - discussed 1 hr vs 2hr - she would like to do 1 hr - HCV not done in MAU. Will draw today  - MOC: Undecided, information given on options. Reviewed if self  pay would do through [facility] - MOF: Both - Circumcision: Will talk to husband  Preterm labor symptoms and general obstetric precautions including but not limited to vaginal bleeding, contractions, leaking of fluid and fetal movement were reviewed in detail with the patient. Please refer to After Visit Summary for other counseling recommendations.   Return in about 2 weeks (around 12/02/2021) for OB VISIT, MD or APP.  Future Appointments  Date Time Provider Department Center  11/24/2021 11:15 AM [facility] NST [facility]  12/01/2021 11:15 AM [facility] NST [facility]    Milas Hock, MD

## 2021-11-17 ENCOUNTER — Ambulatory Visit: Payer: Self-pay | Admitting: *Deleted

## 2021-11-17 ENCOUNTER — Ambulatory Visit (INDEPENDENT_AMBULATORY_CARE_PROVIDER_SITE_OTHER): Payer: Self-pay

## 2021-11-17 VITALS — Wt 261.1 lb

## 2021-11-17 DIAGNOSIS — O99213 Obesity complicating pregnancy, third trimester: Secondary | ICD-10-CM

## 2021-11-17 DIAGNOSIS — O99210 Obesity complicating pregnancy, unspecified trimester: Secondary | ICD-10-CM

## 2021-11-17 DIAGNOSIS — O403XX0 Polyhydramnios, third trimester, not applicable or unspecified: Secondary | ICD-10-CM

## 2021-11-17 NOTE — Progress Notes (Signed)

## 2021-11-18 ENCOUNTER — Encounter: Payer: Self-pay | Admitting: Obstetrics and Gynecology

## 2021-11-18 ENCOUNTER — Ambulatory Visit (INDEPENDENT_AMBULATORY_CARE_PROVIDER_SITE_OTHER): Payer: Self-pay | Admitting: Obstetrics and Gynecology

## 2021-11-18 VITALS — BP 118/53 | HR 100 | Wt 261.5 lb

## 2021-11-18 DIAGNOSIS — O0990 Supervision of high risk pregnancy, unspecified, unspecified trimester: Secondary | ICD-10-CM

## 2021-11-18 DIAGNOSIS — O403XX0 Polyhydramnios, third trimester, not applicable or unspecified: Secondary | ICD-10-CM

## 2021-11-18 DIAGNOSIS — O321XX0 Maternal care for breech presentation, not applicable or unspecified: Secondary | ICD-10-CM

## 2021-11-18 DIAGNOSIS — O34219 Maternal care for unspecified type scar from previous cesarean delivery: Secondary | ICD-10-CM

## 2021-11-18 DIAGNOSIS — O09299 Supervision of pregnancy with other poor reproductive or obstetric history, unspecified trimester: Secondary | ICD-10-CM

## 2021-11-19 ENCOUNTER — Encounter: Payer: Self-pay | Admitting: Obstetrics and Gynecology

## 2021-11-19 ENCOUNTER — Other Ambulatory Visit: Payer: Self-pay | Admitting: *Deleted

## 2021-11-19 DIAGNOSIS — O0990 Supervision of high risk pregnancy, unspecified, unspecified trimester: Secondary | ICD-10-CM

## 2021-11-19 DIAGNOSIS — O24410 Gestational diabetes mellitus in pregnancy, diet controlled: Secondary | ICD-10-CM | POA: Insufficient documentation

## 2021-11-19 DIAGNOSIS — O99810 Abnormal glucose complicating pregnancy: Secondary | ICD-10-CM

## 2021-11-19 DIAGNOSIS — O403XX0 Polyhydramnios, third trimester, not applicable or unspecified: Secondary | ICD-10-CM

## 2021-11-19 LAB — HCV INTERPRETATION

## 2021-11-19 LAB — GLUCOSE TOLERANCE, 1 HOUR: Glucose, 1Hr PP: 195 mg/dL (ref 70–199)

## 2021-11-19 LAB — HCV AB W REFLEX TO QUANT PCR: HCV Ab: NONREACTIVE

## 2021-11-19 NOTE — Progress Notes (Signed)
Called pt regarding 1hr GTT result. She was advised that Dr. Damita Dunnings recommends following Diabetic diet and checking blood sugar daily.  Pt voiced understanding and agreed to Diabetes Ed appt on 6/8 @ 0915.

## 2021-11-20 ENCOUNTER — Encounter: Payer: Self-pay | Attending: Obstetrics and Gynecology | Admitting: Registered"

## 2021-11-20 ENCOUNTER — Ambulatory Visit (INDEPENDENT_AMBULATORY_CARE_PROVIDER_SITE_OTHER): Payer: Self-pay | Admitting: Registered"

## 2021-11-20 ENCOUNTER — Encounter: Payer: Self-pay | Admitting: *Deleted

## 2021-11-20 DIAGNOSIS — Z713 Dietary counseling and surveillance: Secondary | ICD-10-CM | POA: Insufficient documentation

## 2021-11-20 DIAGNOSIS — O99810 Abnormal glucose complicating pregnancy: Secondary | ICD-10-CM | POA: Insufficient documentation

## 2021-11-20 DIAGNOSIS — O0990 Supervision of high risk pregnancy, unspecified, unspecified trimester: Secondary | ICD-10-CM | POA: Insufficient documentation

## 2021-11-20 DIAGNOSIS — O24410 Gestational diabetes mellitus in pregnancy, diet controlled: Secondary | ICD-10-CM

## 2021-11-20 DIAGNOSIS — O403XX0 Polyhydramnios, third trimester, not applicable or unspecified: Secondary | ICD-10-CM | POA: Insufficient documentation

## 2021-11-20 DIAGNOSIS — Z3A32 32 weeks gestation of pregnancy: Secondary | ICD-10-CM | POA: Insufficient documentation

## 2021-11-20 NOTE — Progress Notes (Signed)
Patient was seen for Gestational Diabetes self-management on 11/20/2021  Start time 0921 and End time 1005   Estimated due date: 01/07/22; [redacted]w[redacted]d  Clinical: Medications: reviewed Medical History: polyhydramnios, Hx preeclampsia Labs: OGTT --, A1c 6.1% on 11/11/21   Dietary and Lifestyle History: Pt states she doesn't eat candy and not a lot of fruit. Pt states her main carbohydrate source comes from tortillas and rice. Pt states she drinks mostly water but has coke, sweet tea or juice sometimes with meals.  Pt states her brother is a Systems analyst and can help her with increasing exercise.  Physical Activity: sporadic Stress: high Sleep: not assessed  24 hr Recall:  First Meal: ~10 am cereal OR egg, ham cheese sandwich Snack: -- Second meal: chick-fil-a: grilled ckn sandwich on whole wheat bun, fries, sweet tea Snack: -- Third meal: rice, chicken, salad Snack: usually not, sometimes cereal Beverages: water, coke, sweet tea, juice (2x/week)  NUTRITION INTERVENTION  Nutrition education (E-1) on the following topics:   Initial Follow-up  [x]  []  Definition of Gestational Diabetes [x]  []  Why dietary management is important in controlling blood glucose [x]  []  Effects each nutrient has on blood glucose levels []  []  Simple carbohydrates vs complex carbohydrates []  []  Fluid intake [x]  []  Creating a balanced meal plan [x]  []  Carbohydrate counting  [x]  []  When to check blood glucose levels [x]  []  Proper blood glucose monitoring techniques [x]  []  Effect of stress and stress reduction techniques  [x]  []  Exercise effect on blood glucose levels, appropriate exercise during pregnancy []  []  Importance of limiting caffeine and abstaining from alcohol and smoking [x]  []  Medications used for blood sugar control during pregnancy []  []  Hypoglycemia and rule of 15 [x]  []  Postpartum self care  Blood glucose monitor given: Prodigy CBG: 112 mg/dL  Patient instructed to monitor  glucose levels: FBS: 60 - ? 95 mg/dL (some clinics use 90 for cutoff) 1 hour: ? 140 mg/dL 2 hour: ? mg/dL  Patient received handouts: Nutrition Diabetes and Pregnancy Carbohydrate Counting List A1c chart  Patient will be seen for follow-up as needed.

## 2021-11-24 ENCOUNTER — Ambulatory Visit (INDEPENDENT_AMBULATORY_CARE_PROVIDER_SITE_OTHER): Payer: Self-pay

## 2021-11-24 ENCOUNTER — Ambulatory Visit: Payer: Self-pay | Admitting: *Deleted

## 2021-11-24 VITALS — BP 115/73 | HR 96

## 2021-11-24 DIAGNOSIS — O99210 Obesity complicating pregnancy, unspecified trimester: Secondary | ICD-10-CM

## 2021-11-24 DIAGNOSIS — O0930 Supervision of pregnancy with insufficient antenatal care, unspecified trimester: Secondary | ICD-10-CM

## 2021-11-24 DIAGNOSIS — O403XX0 Polyhydramnios, third trimester, not applicable or unspecified: Secondary | ICD-10-CM

## 2021-11-24 DIAGNOSIS — Z3A33 33 weeks gestation of pregnancy: Secondary | ICD-10-CM

## 2021-11-24 NOTE — Progress Notes (Signed)

## 2021-12-01 ENCOUNTER — Ambulatory Visit (INDEPENDENT_AMBULATORY_CARE_PROVIDER_SITE_OTHER): Payer: Self-pay

## 2021-12-01 ENCOUNTER — Ambulatory Visit (INDEPENDENT_AMBULATORY_CARE_PROVIDER_SITE_OTHER): Payer: Self-pay | Admitting: General Practice

## 2021-12-01 VITALS — BP 135/71 | HR 96

## 2021-12-01 DIAGNOSIS — O403XX0 Polyhydramnios, third trimester, not applicable or unspecified: Secondary | ICD-10-CM

## 2021-12-01 NOTE — Progress Notes (Signed)
Pt informed that the ultrasound is considered a limited OB ultrasound and is not intended to be a complete ultrasound exam.  Patient also informed that the ultrasound is not being completed with the intent of assessing for fetal or placental anomalies or any pelvic abnormalities.  Explained that the purpose of today's ultrasound is to assess for  BPP, presentation, and AFI.  Patient acknowledges the purpose of the exam and the limitations of the study.     Abiha Lukehart H RN BSN 12/01/21  

## 2021-12-02 ENCOUNTER — Encounter: Payer: Self-pay | Admitting: Family Medicine

## 2021-12-02 ENCOUNTER — Ambulatory Visit (INDEPENDENT_AMBULATORY_CARE_PROVIDER_SITE_OTHER): Payer: Self-pay | Admitting: Family Medicine

## 2021-12-02 VITALS — BP 123/76 | HR 106 | Wt 262.4 lb

## 2021-12-02 DIAGNOSIS — O321XX0 Maternal care for breech presentation, not applicable or unspecified: Secondary | ICD-10-CM

## 2021-12-02 DIAGNOSIS — O09899 Supervision of other high risk pregnancies, unspecified trimester: Secondary | ICD-10-CM | POA: Insufficient documentation

## 2021-12-02 DIAGNOSIS — O0990 Supervision of high risk pregnancy, unspecified, unspecified trimester: Secondary | ICD-10-CM

## 2021-12-02 DIAGNOSIS — O09299 Supervision of pregnancy with other poor reproductive or obstetric history, unspecified trimester: Secondary | ICD-10-CM

## 2021-12-02 DIAGNOSIS — Z98891 History of uterine scar from previous surgery: Secondary | ICD-10-CM | POA: Insufficient documentation

## 2021-12-02 DIAGNOSIS — O24410 Gestational diabetes mellitus in pregnancy, diet controlled: Secondary | ICD-10-CM

## 2021-12-02 DIAGNOSIS — O34219 Maternal care for unspecified type scar from previous cesarean delivery: Secondary | ICD-10-CM

## 2021-12-02 DIAGNOSIS — Z2839 Other underimmunization status: Secondary | ICD-10-CM

## 2021-12-02 DIAGNOSIS — O403XX0 Polyhydramnios, third trimester, not applicable or unspecified: Secondary | ICD-10-CM

## 2021-12-02 DIAGNOSIS — O99210 Obesity complicating pregnancy, unspecified trimester: Secondary | ICD-10-CM

## 2021-12-02 MED ORDER — METFORMIN HCL 500 MG PO TABS: 500.0000 mg | ORAL_TABLET | Freq: Every evening | ORAL | 5 refills | Status: DC

## 2021-12-02 NOTE — Progress Notes (Signed)
   Subjective:  Meredith [name] is a 31 y.o. [id number] at [redacted]w[redacted]d being seen today for ongoing prenatal care.  She is currently monitored for the following issues for this high-risk pregnancy and has Hx of cesarean section complicating pregnancy; Fetal demise at [redacted] weeks gestation; Hx of preeclampsia, prior pregnancy, currently pregnant; Polyhydramnios; Breech presentation; Supervision of high risk pregnancy, antepartum; Obesity in pregnancy, antepartum; GDM, class A1; History of VBAC; and Rubella non-immune status, antepartum on their problem list.  Patient reports no complaints.  Contractions: Not present. Vag. Bleeding: None.  Movement: Present. Denies leaking of fluid.   The following portions of the patient's history were reviewed and updated as appropriate: allergies, current medications, past family history, past medical history, past social history, past surgical history and problem list. Problem list updated.  Objective:   Vitals:   12/02/21 0930  BP: 123/76  Pulse: (!) 106  Weight: 262 lb 6.4 oz (119 kg)    Fetal Status: Fetal Heart Rate (bpm): 145   Movement: Present     General:  Alert, oriented and cooperative. Patient is in no acute distress.  Skin: Skin is warm and dry. No rash noted.   Cardiovascular: Normal heart rate noted  Respiratory: Normal respiratory effort, no problems with respiration noted  Abdomen: Soft, gravid, appropriate for gestational age. Pain/Pressure: Absent     Pelvic: Vag. Bleeding: None     Cervical exam deferred        Extremities: Normal range of motion.     Mental Status: Normal mood and affect. Normal behavior. Normal judgment and thought content.   Urinalysis:      Assessment and Plan:  Pregnancy: [id number] at [redacted]w[redacted]d  1. Supervision of high risk pregnancy, antepartum BP and FHR normal  2. Hx of cesarean section complicating pregnancy TOLAC consent signed last visit  3. History of VBAC CS>VBAC x2  4. Hx of preeclampsia, prior pregnancy,  currently pregnant Not on ASA (late to care), normotensive  5. Polyhydramnios in third trimester complication, single or unspecified fetus Last AFI 27 cm, following w MFM  6. Breech presentation, single or unspecified fetus Strongly hopes to avoid cesarean Check presentation next visit, counsel for ECV PRN Would be interested in BTL if she needs a cesarean  7. Obesity in pregnancy, antepartum   8. GDM, class A1 Did not bring log From recall fastings are high 90's, can't get them under 95 though Post prandials are consistently under 120 Start metformin $RemoveBeforeD'500mg'[id number]$  QHS Already engaged in weekly testing Last growth [place] 11/11/2021, EFW 35%, 1824g, AFI 27cm Weekly BPP have been reassuring  9. Rubella non-immune status, antepartum Offer MMR PP  Preterm labor symptoms and general obstetric precautions including but not limited to vaginal bleeding, contractions, leaking of fluid and fetal movement were reviewed in detail with the patient. Please refer to After Visit Summary for other counseling recommendations.  Return in 2 weeks (on 12/16/2021) for [facility], ob visit.   Clarnce Flock, MD

## 2021-12-02 NOTE — Patient Instructions (Signed)

## 2021-12-08 ENCOUNTER — Other Ambulatory Visit: Payer: Self-pay

## 2021-12-08 ENCOUNTER — Ambulatory Visit (INDEPENDENT_AMBULATORY_CARE_PROVIDER_SITE_OTHER): Payer: Self-pay

## 2021-12-08 ENCOUNTER — Ambulatory Visit: Payer: Self-pay | Admitting: *Deleted

## 2021-12-08 VITALS — BP 119/65 | HR 100

## 2021-12-08 DIAGNOSIS — O99210 Obesity complicating pregnancy, unspecified trimester: Secondary | ICD-10-CM

## 2021-12-08 DIAGNOSIS — O24415 Gestational diabetes mellitus in pregnancy, controlled by oral hypoglycemic drugs: Secondary | ICD-10-CM

## 2021-12-08 DIAGNOSIS — O0930 Supervision of pregnancy with insufficient antenatal care, unspecified trimester: Secondary | ICD-10-CM

## 2021-12-08 DIAGNOSIS — O403XX0 Polyhydramnios, third trimester, not applicable or unspecified: Secondary | ICD-10-CM

## 2021-12-08 NOTE — Progress Notes (Signed)
Pt informed that the ultrasound is considered a limited OB ultrasound and is not intended to be a complete ultrasound exam.  Patient also informed that the ultrasound is not being completed with the intent of assessing for fetal or placental anomalies or any pelvic abnormalities.  Explained that the purpose of today's ultrasound is to assess for presentation, BPP and amniotic fluid volume.  Patient acknowledges the purpose of the exam and the limitations of the study.    Pt states she has not picked up Metformin from pharmacy - did not know it was ready @ pharamcy.  Fasting CBG's continue to be 98-108.  She will obtain medication today.

## 2021-12-10 ENCOUNTER — Encounter: Payer: Self-pay | Admitting: Family Medicine

## 2021-12-17 ENCOUNTER — Other Ambulatory Visit (HOSPITAL_COMMUNITY)
Admission: RE | Admit: 2021-12-17 | Discharge: 2021-12-17 | Disposition: A | Discharge status: Home / Self Care | Payer: Self-pay | Source: Ambulatory Visit | Attending: Family Medicine | Admitting: Family Medicine

## 2021-12-17 ENCOUNTER — Ambulatory Visit (INDEPENDENT_AMBULATORY_CARE_PROVIDER_SITE_OTHER): Payer: Medicaid Other

## 2021-12-17 ENCOUNTER — Ambulatory Visit (INDEPENDENT_AMBULATORY_CARE_PROVIDER_SITE_OTHER): Payer: Self-pay | Admitting: Family Medicine

## 2021-12-17 ENCOUNTER — Other Ambulatory Visit: Payer: Self-pay

## 2021-12-17 ENCOUNTER — Ambulatory Visit: Payer: Self-pay | Admitting: *Deleted

## 2021-12-17 VITALS — BP 117/78 | HR 91

## 2021-12-17 VITALS — BP 122/73 | HR 108 | Wt 267.3 lb

## 2021-12-17 DIAGNOSIS — O24415 Gestational diabetes mellitus in pregnancy, controlled by oral hypoglycemic drugs: Secondary | ICD-10-CM | POA: Diagnosis not present

## 2021-12-17 DIAGNOSIS — O0990 Supervision of high risk pregnancy, unspecified, unspecified trimester: Secondary | ICD-10-CM

## 2021-12-17 DIAGNOSIS — O99210 Obesity complicating pregnancy, unspecified trimester: Secondary | ICD-10-CM | POA: Diagnosis not present

## 2021-12-17 DIAGNOSIS — O09299 Supervision of pregnancy with other poor reproductive or obstetric history, unspecified trimester: Secondary | ICD-10-CM

## 2021-12-17 DIAGNOSIS — Z98891 History of uterine scar from previous surgery: Secondary | ICD-10-CM

## 2021-12-17 DIAGNOSIS — O403XX0 Polyhydramnios, third trimester, not applicable or unspecified: Secondary | ICD-10-CM

## 2021-12-17 DIAGNOSIS — Z2839 Other underimmunization status: Secondary | ICD-10-CM

## 2021-12-17 DIAGNOSIS — O09899 Supervision of other high risk pregnancies, unspecified trimester: Secondary | ICD-10-CM

## 2021-12-17 DIAGNOSIS — O24410 Gestational diabetes mellitus in pregnancy, diet controlled: Secondary | ICD-10-CM

## 2021-12-17 DIAGNOSIS — O34219 Maternal care for unspecified type scar from previous cesarean delivery: Secondary | ICD-10-CM

## 2021-12-17 NOTE — Progress Notes (Signed)
   PRENATAL VISIT NOTE  Subjective:  Meredith [name] is a 31 y.o. [phone] at [redacted]w[redacted]d being seen today for ongoing prenatal care.  She is currently monitored for the following issues for this high-risk pregnancy and has Hx of cesarean section complicating pregnancy; Fetal demise at [redacted] weeks gestation; Hx of preeclampsia, prior pregnancy, currently pregnant; Polyhydramnios; Breech presentation; Supervision of high risk pregnancy, antepartum; Obesity in pregnancy, antepartum; GDM, class A1; History of VBAC; and Rubella non-immune status, antepartum on their problem list.  Patient reports no complaints.  Contractions: Irritability. Vag. Bleeding: None.  Movement: Present. Denies leaking of fluid.   The following portions of the patient's history were reviewed and updated as appropriate: allergies, current medications, past family history, past medical history, past social history, past surgical history and problem list.   Objective:   Vitals:   12/17/21 1340  BP: 122/73  Pulse: (!) 108  Weight: 267 lb 4.8 oz (121.2 kg)    Fetal Status: Fetal Heart Rate (bpm): 171   Movement: Present  Presentation: Vertex  General:  Alert, oriented and cooperative. Patient is in no acute distress.  Skin: Skin is warm and dry. No rash noted.   Cardiovascular: Normal heart rate noted  Respiratory: Normal respiratory effort, no problems with respiration noted  Abdomen: Soft, gravid, appropriate for gestational age.  Pain/Pressure: Present     Pelvic: Cervical exam performed in the presence of a chaperone Dilation: 1 Effacement (%): 50 Station: -3  Extremities: Normal range of motion.  Edema: Trace  Mental Status: Normal mood and affect. Normal behavior. Normal judgment and thought content.   Assessment and Plan:  Pregnancy: [id number] at [redacted]w[redacted]d 1. Supervision of high risk pregnancy, antepartum GBS, GC/CT done today.  2. Polyhydramnios in third trimester complication, single or unspecified fetus Resolved. AFI  normal today  3. GDM, class A2 Continue metformin. Continue BPPs Induce at 39 weeks WIll schedule for foley balloon insertion day before. Growth [place] ordered - CBC; Standing - Type and screen; Standing - RPR; Standing  4. Hx of cesarean section complicating pregnancy Desires rpt VBAC  5. History of VBAC  6. Rubella non-immune status, antepartum MMR post delivery  7. Hx of preeclampsia, prior pregnancy, currently pregnant BP normal  Term labor symptoms and general obstetric precautions including but not limited to vaginal bleeding, contractions, leaking of fluid and fetal movement were reviewed in detail with the patient. Please refer to After Visit Summary for other counseling recommendations.   No follow-ups on file.  Future Appointments  Date Time Provider [place]  12/24/2021  1:55 PM Radene Gunning, MD [facility]  12/24/2021  2:15 PM [facility] NST [facility]  12/31/2021  1:35 PM Radene Gunning, MD [facility]  12/31/2021  2:15 PM [facility] NST [facility]  01/07/2022 11:15 AM Radene Gunning, MD [facility], DO

## 2021-12-17 NOTE — Progress Notes (Signed)

## 2021-12-18 LAB — CERVICOVAGINAL ANCILLARY ONLY
Chlamydia: NEGATIVE
Comment: NEGATIVE
Comment: NORMAL
Neisseria Gonorrhea: NEGATIVE

## 2021-12-19 ENCOUNTER — Ambulatory Visit: Payer: Self-pay

## 2021-12-19 ENCOUNTER — Ambulatory Visit: Payer: Self-pay | Attending: Family Medicine

## 2021-12-21 LAB — CULTURE, BETA STREP (GROUP B ONLY): Strep Gp B Culture: NEGATIVE

## 2021-12-22 NOTE — Progress Notes (Unsigned)
   PRENATAL VISIT NOTE  Subjective:  Meredith [name] is a 31 y.o. [phone] at [redacted]w[redacted]d being seen today for ongoing prenatal care.  She is currently monitored for the following issues for this high-risk pregnancy and has Hx of cesarean section complicating pregnancy; Fetal demise at [redacted] weeks gestation; Hx of preeclampsia, prior pregnancy, currently pregnant; Supervision of high risk pregnancy, antepartum; Obesity in pregnancy, antepartum; GDM, class A1; History of VBAC; and Rubella non-immune status, antepartum on their problem list.  Patient reports {sx:14538}.   .  .   . Denies leaking of fluid.   The following portions of the patient's history were reviewed and updated as appropriate: allergies, current medications, past family history, past medical history, past social history, past surgical history and problem list.   Objective:  There were no vitals filed for this visit.  Fetal Status:           General:  Alert, oriented and cooperative. Patient is in no acute distress.  Skin: Skin is warm and dry. No rash noted.   Cardiovascular: Normal heart rate noted  Respiratory: Normal respiratory effort, no problems with respiration noted  Abdomen: Soft, gravid, appropriate for gestational age.        Pelvic: {Blank single:19197::"Cervical exam performed in the presence of a chaperone","Cervical exam deferred"}        Extremities: Normal range of motion.     Mental Status: Normal mood and affect. Normal behavior. Normal judgment and thought content.   Assessment and Plan:  Pregnancy: [id number] at [redacted]w[redacted]d 1. Hx of cesarean section complicating pregnancy Desires TOLAC  2. Hx of preeclampsia, prior pregnancy, currently pregnant BP today ***  3. Supervision of high risk pregnancy, antepartum GBS negative  4. Obesity in pregnancy, antepartum  5. History of VBAC Desires TOLAC. Consent signed on 6/6  6. Rubella non-immune status, antepartum MMR after delivery  7. GDM, class A2 Continue  metformin. Continue BPPs Growth had been normal at 34%ile on 5/30 - was scheduled for repeat on 7/7 but did not occur. Scheduled for IOL on 7/19 and has appt on 7/19. Will move appt to 7/18 for FB insertion and NST to follow. ***  Term labor symptoms and general obstetric precautions including but not limited to vaginal bleeding, contractions, leaking of fluid and fetal movement were reviewed in detail with the patient. Please refer to After Visit Summary for other counseling recommendations.   No follow-ups on file.  Future Appointments  Date Time Provider [place]  12/24/2021  1:55 PM Radene Gunning, MD [facility] [place]  12/24/2021  2:15 PM [facility] NST [facility]  12/31/2021  6:30 AM [facility] SCHED ROOM [facility]INDC None  12/31/2021  1:35 PM Radene Gunning, MD [facility]  12/31/2021  2:15 PM [facility] NST [facility]  01/07/2022 11:15 AM Radene Gunning, MD [facility]    Radene Gunning, MD

## 2021-12-22 NOTE — Progress Notes (Signed)
She was Pregnancy with 32 completed weeks at the time of the visit.   Milas Hock, MD Attending Obstetrician & Gynecologist, [facility] for [facility], [facility]

## 2021-12-24 ENCOUNTER — Ambulatory Visit: Payer: Self-pay | Admitting: *Deleted

## 2021-12-24 ENCOUNTER — Ambulatory Visit (INDEPENDENT_AMBULATORY_CARE_PROVIDER_SITE_OTHER): Payer: Self-pay | Admitting: Obstetrics and Gynecology

## 2021-12-24 ENCOUNTER — Ambulatory Visit (INDEPENDENT_AMBULATORY_CARE_PROVIDER_SITE_OTHER): Payer: Medicaid Other

## 2021-12-24 ENCOUNTER — Other Ambulatory Visit: Payer: Self-pay | Admitting: Advanced Practice Midwife

## 2021-12-24 ENCOUNTER — Other Ambulatory Visit: Payer: Self-pay

## 2021-12-24 ENCOUNTER — Encounter: Payer: Self-pay | Admitting: Obstetrics and Gynecology

## 2021-12-24 VITALS — BP 129/56 | HR 101 | Wt 267.0 lb

## 2021-12-24 DIAGNOSIS — Z2839 Other underimmunization status: Secondary | ICD-10-CM

## 2021-12-24 DIAGNOSIS — Z98891 History of uterine scar from previous surgery: Secondary | ICD-10-CM

## 2021-12-24 DIAGNOSIS — O99210 Obesity complicating pregnancy, unspecified trimester: Secondary | ICD-10-CM

## 2021-12-24 DIAGNOSIS — O24415 Gestational diabetes mellitus in pregnancy, controlled by oral hypoglycemic drugs: Secondary | ICD-10-CM

## 2021-12-24 DIAGNOSIS — O0990 Supervision of high risk pregnancy, unspecified, unspecified trimester: Secondary | ICD-10-CM

## 2021-12-24 DIAGNOSIS — O09899 Supervision of other high risk pregnancies, unspecified trimester: Secondary | ICD-10-CM

## 2021-12-24 DIAGNOSIS — O09299 Supervision of pregnancy with other poor reproductive or obstetric history, unspecified trimester: Secondary | ICD-10-CM

## 2021-12-24 DIAGNOSIS — Z3490 Encounter for supervision of normal pregnancy, unspecified, unspecified trimester: Secondary | ICD-10-CM

## 2021-12-24 DIAGNOSIS — O34219 Maternal care for unspecified type scar from previous cesarean delivery: Secondary | ICD-10-CM

## 2021-12-24 DIAGNOSIS — O24410 Gestational diabetes mellitus in pregnancy, diet controlled: Secondary | ICD-10-CM

## 2021-12-30 ENCOUNTER — Other Ambulatory Visit: Payer: Self-pay

## 2021-12-30 ENCOUNTER — Encounter: Payer: Self-pay | Admitting: Family Medicine

## 2021-12-30 ENCOUNTER — Ambulatory Visit (INDEPENDENT_AMBULATORY_CARE_PROVIDER_SITE_OTHER): Payer: Medicaid Other | Admitting: Family Medicine

## 2021-12-30 ENCOUNTER — Encounter: Payer: Self-pay | Admitting: General Practice

## 2021-12-30 VITALS — BP 130/86 | HR 99 | Wt 273.4 lb

## 2021-12-30 DIAGNOSIS — O09899 Supervision of other high risk pregnancies, unspecified trimester: Secondary | ICD-10-CM | POA: Diagnosis not present

## 2021-12-30 DIAGNOSIS — O0990 Supervision of high risk pregnancy, unspecified, unspecified trimester: Secondary | ICD-10-CM

## 2021-12-30 DIAGNOSIS — Z98891 History of uterine scar from previous surgery: Secondary | ICD-10-CM

## 2021-12-30 DIAGNOSIS — O09299 Supervision of pregnancy with other poor reproductive or obstetric history, unspecified trimester: Secondary | ICD-10-CM

## 2021-12-30 DIAGNOSIS — Z3490 Encounter for supervision of normal pregnancy, unspecified, unspecified trimester: Secondary | ICD-10-CM | POA: Diagnosis not present

## 2021-12-30 DIAGNOSIS — O24410 Gestational diabetes mellitus in pregnancy, diet controlled: Secondary | ICD-10-CM | POA: Diagnosis not present

## 2021-12-30 DIAGNOSIS — O99210 Obesity complicating pregnancy, unspecified trimester: Secondary | ICD-10-CM | POA: Diagnosis not present

## 2021-12-30 DIAGNOSIS — Z2839 Other underimmunization status: Secondary | ICD-10-CM

## 2021-12-30 DIAGNOSIS — Z3A38 38 weeks gestation of pregnancy: Secondary | ICD-10-CM

## 2021-12-30 NOTE — Progress Notes (Signed)
   Subjective:  Meredith [name] is a 31 y.o. [id number] at 55w6dbeing seen today for ongoing prenatal care.  She is currently monitored for the following issues for this high-risk pregnancy and has Hx of cesarean section complicating pregnancy; Fetal demise at [redacted] weeks gestation; Hx of preeclampsia, prior pregnancy, currently pregnant; Supervision of high risk pregnancy, antepartum; Obesity in pregnancy, antepartum; GDM, class A1; History of VBAC; and Rubella non-immune status, antepartum on their problem list.  Patient reports no complaints.  Contractions: Irritability. Vag. Bleeding: None.  Movement: Present. Denies leaking of fluid.   The following portions of the patient's history were reviewed and updated as appropriate: allergies, current medications, past family history, past medical history, past social history, past surgical history and problem list. Problem list updated.  Objective:   Vitals:   12/30/21 1416  BP: 130/86  Pulse: 99  Weight: 273 lb 6.4 oz (124 kg)    Fetal Status: Fetal Heart Rate (bpm): 145   Movement: Present  Presentation: Vertex  General:  Alert, oriented and cooperative. Patient is in no acute distress.  Skin: Skin is warm and dry. No rash noted.   Cardiovascular: Normal heart rate noted  Respiratory: Normal respiratory effort, no problems with respiration noted  Abdomen: Soft, gravid, appropriate for gestational age. Pain/Pressure: Present     Pelvic: Vag. Bleeding: None     Cervical exam performed Dilation: 1 Effacement (%): Thick Station: -3  Extremities: Normal range of motion.     Mental Status: Normal mood and affect. Normal behavior. Normal judgment and thought content.   Urinalysis:      Assessment and Plan:  Pregnancy: [id number] [id number]. Obesity in pregnancy, antepartum NST reactive x2 - Fetal nonstress test; Future  2. Encounter for elective induction of labor NST reactive before and after procedure Scheduled for IOL tomorrow morning, FB  placed manually with ease and filled with 30cc Given anticipatory guidance on insertion and when to present to hospital early - Fetal nonstress test; Future  3. Supervision of high risk pregnancy, antepartum BP and FHR normal  4. Rubella non-immune status, antepartum Offer MMR PP  5. Hx of preeclampsia, prior pregnancy, currently pregnant Normotensive, not on ASA unfortunately  6. History of VBAC CS x2 > VBAC x2, desires TOLAC, consent signed 11/18/2021  7. GDM, class A2 On metforming [id number] Scheduled for IOL tomorrow FB placed as above  Term labor symptoms and general obstetric precautions including but not limited to vaginal bleeding, contractions, leaking of fluid and fetal movement were reviewed in detail with the patient. Please refer to After Visit Summary for other counseling recommendations.  Return in about 6 weeks (around 02/10/2022) for PP check.   EckClarnce FlockD

## 2021-12-31 ENCOUNTER — Other Ambulatory Visit: Payer: Self-pay

## 2021-12-31 ENCOUNTER — Encounter: Payer: Self-pay | Admitting: Obstetrics and Gynecology

## 2021-12-31 ENCOUNTER — Inpatient Hospital Stay (HOSPITAL_COMMUNITY)
Admission: AD | Admit: 2021-12-31 | Discharge: 2022-01-03 | DRG: 787 | Disposition: A | Discharge status: Home / Self Care | Payer: Medicaid Other | Attending: Obstetrics and Gynecology | Admitting: Obstetrics and Gynecology

## 2021-12-31 ENCOUNTER — Ambulatory Visit: Payer: Self-pay

## 2021-12-31 ENCOUNTER — Inpatient Hospital Stay (HOSPITAL_COMMUNITY): Payer: Medicaid Other

## 2021-12-31 ENCOUNTER — Encounter (HOSPITAL_COMMUNITY): Payer: Self-pay | Admitting: Family Medicine

## 2021-12-31 DIAGNOSIS — O34219 Maternal care for unspecified type scar from previous cesarean delivery: Secondary | ICD-10-CM | POA: Diagnosis present

## 2021-12-31 DIAGNOSIS — O24429 Gestational diabetes mellitus in childbirth, unspecified control: Secondary | ICD-10-CM | POA: Diagnosis not present

## 2021-12-31 DIAGNOSIS — O09299 Supervision of pregnancy with other poor reproductive or obstetric history, unspecified trimester: Secondary | ICD-10-CM

## 2021-12-31 DIAGNOSIS — Z3A39 39 weeks gestation of pregnancy: Secondary | ICD-10-CM

## 2021-12-31 DIAGNOSIS — O34211 Maternal care for low transverse scar from previous cesarean delivery: Secondary | ICD-10-CM | POA: Diagnosis present

## 2021-12-31 DIAGNOSIS — O99210 Obesity complicating pregnancy, unspecified trimester: Secondary | ICD-10-CM | POA: Diagnosis present

## 2021-12-31 DIAGNOSIS — O24419 Gestational diabetes mellitus in pregnancy, unspecified control: Secondary | ICD-10-CM

## 2021-12-31 DIAGNOSIS — Z8616 Personal history of COVID-19: Secondary | ICD-10-CM

## 2021-12-31 DIAGNOSIS — O99214 Obesity complicating childbirth: Secondary | ICD-10-CM | POA: Diagnosis present

## 2021-12-31 DIAGNOSIS — O24425 Gestational diabetes mellitus in childbirth, controlled by oral hypoglycemic drugs: Secondary | ICD-10-CM | POA: Diagnosis present

## 2021-12-31 DIAGNOSIS — O139 Gestational [pregnancy-induced] hypertension without significant proteinuria, unspecified trimester: Secondary | ICD-10-CM | POA: Diagnosis not present

## 2021-12-31 DIAGNOSIS — Z23 Encounter for immunization: Secondary | ICD-10-CM

## 2021-12-31 DIAGNOSIS — O09292 Supervision of pregnancy with other poor reproductive or obstetric history, second trimester: Secondary | ICD-10-CM | POA: Diagnosis not present

## 2021-12-31 DIAGNOSIS — O9081 Anemia of the puerperium: Secondary | ICD-10-CM | POA: Diagnosis not present

## 2021-12-31 DIAGNOSIS — O134 Gestational [pregnancy-induced] hypertension without significant proteinuria, complicating childbirth: Secondary | ICD-10-CM | POA: Diagnosis present

## 2021-12-31 DIAGNOSIS — D62 Acute posthemorrhagic anemia: Secondary | ICD-10-CM | POA: Diagnosis not present

## 2021-12-31 DIAGNOSIS — O24410 Gestational diabetes mellitus in pregnancy, diet controlled: Secondary | ICD-10-CM

## 2021-12-31 DIAGNOSIS — Z87891 Personal history of nicotine dependence: Secondary | ICD-10-CM

## 2021-12-31 DIAGNOSIS — O24424 Gestational diabetes mellitus in childbirth, insulin controlled: Secondary | ICD-10-CM | POA: Diagnosis not present

## 2021-12-31 DIAGNOSIS — Z6841 Body Mass Index (BMI) 40.0 and over, adult: Secondary | ICD-10-CM | POA: Diagnosis not present

## 2021-12-31 DIAGNOSIS — O09899 Supervision of other high risk pregnancies, unspecified trimester: Secondary | ICD-10-CM

## 2021-12-31 DIAGNOSIS — Z8759 Personal history of other complications of pregnancy, childbirth and the puerperium: Secondary | ICD-10-CM | POA: Diagnosis not present

## 2021-12-31 DIAGNOSIS — E669 Obesity, unspecified: Secondary | ICD-10-CM | POA: Diagnosis not present

## 2021-12-31 DIAGNOSIS — Z2839 Other underimmunization status: Secondary | ICD-10-CM

## 2021-12-31 DIAGNOSIS — Z98891 History of uterine scar from previous surgery: Secondary | ICD-10-CM

## 2021-12-31 DIAGNOSIS — O0990 Supervision of high risk pregnancy, unspecified, unspecified trimester: Secondary | ICD-10-CM

## 2021-12-31 DIAGNOSIS — O135 Gestational [pregnancy-induced] hypertension without significant proteinuria, complicating the puerperium: Secondary | ICD-10-CM | POA: Diagnosis not present

## 2021-12-31 DIAGNOSIS — Z3490 Encounter for supervision of normal pregnancy, unspecified, unspecified trimester: Secondary | ICD-10-CM | POA: Diagnosis present

## 2021-12-31 HISTORY — DX: Gestational diabetes mellitus in pregnancy, unspecified control: O24.419

## 2021-12-31 LAB — CBC
HCT: 28.5 % — ABNORMAL LOW (ref 36.0–46.0)
Hemoglobin: 8.8 g/dL — ABNORMAL LOW (ref 12.0–15.0)
MCH: 23.2 pg — ABNORMAL LOW (ref 26.0–34.0)
MCHC: 30.9 g/dL (ref 30.0–36.0)
MCV: 75 fL — ABNORMAL LOW (ref 80.0–100.0)
Platelets: 224 10*3/uL (ref 150–400)
RBC: 3.8 MIL/uL — ABNORMAL LOW (ref 3.87–5.11)
RDW: 15 % (ref 11.5–15.5)
WBC: 9.2 10*3/uL (ref 4.0–10.5)
nRBC: 0.2 % (ref 0.0–0.2)

## 2021-12-31 LAB — TYPE AND SCREEN
ABO/RH(D): O POS
Antibody Screen: NEGATIVE

## 2021-12-31 LAB — GLUCOSE, CAPILLARY: Glucose-Capillary: 107 mg/dL — ABNORMAL HIGH (ref 70–99)

## 2021-12-31 MED ORDER — OXYCODONE-ACETAMINOPHEN 5-325 MG PO TABS: 1.0000 | ORAL_TABLET | ORAL | Status: DC | PRN

## 2021-12-31 MED ORDER — LACTATED RINGERS IV SOLN: 500.0000 mL | INTRAVENOUS | Status: DC | PRN

## 2021-12-31 MED ORDER — OXYTOCIN-SODIUM CHLORIDE 30-0.9 UT/500ML-% IV SOLN
1.0000 m[IU]/min | INTRAVENOUS | Status: DC
  Filled 2021-12-31: qty 500

## 2021-12-31 MED ORDER — OXYTOCIN-SODIUM CHLORIDE 30-0.9 UT/500ML-% IV SOLN
2.5000 [IU]/h | INTRAVENOUS | Status: DC
  Administered 2022-01-01: 12 [IU]/h via INTRAVENOUS

## 2021-12-31 MED ORDER — ONDANSETRON HCL 4 MG/2ML IJ SOLN: 4.0000 mg | Freq: Four times a day (QID) | INTRAMUSCULAR | Status: DC | PRN

## 2021-12-31 MED ORDER — OXYTOCIN BOLUS FROM INFUSION: 333.0000 mL | Freq: Once | INTRAVENOUS | Status: DC

## 2021-12-31 MED ORDER — TERBUTALINE SULFATE 1 MG/ML IJ SOLN: 0.2500 mg | Freq: Once | INTRAMUSCULAR | Status: DC | PRN

## 2021-12-31 MED ORDER — SOD CITRATE-CITRIC ACID 500-334 MG/5ML PO SOLN: 30.0000 mL | ORAL | Status: DC | PRN

## 2021-12-31 MED ORDER — OXYCODONE-ACETAMINOPHEN 5-325 MG PO TABS: 2.0000 | ORAL_TABLET | ORAL | Status: DC | PRN

## 2021-12-31 MED ORDER — OXYTOCIN-SODIUM CHLORIDE 30-0.9 UT/500ML-% IV SOLN
1.0000 m[IU]/min | INTRAVENOUS | Status: DC
  Administered 2021-12-31: 2 m[IU]/min via INTRAVENOUS

## 2021-12-31 MED ORDER — LACTATED RINGERS IV SOLN: INTRAVENOUS | Status: DC

## 2021-12-31 MED ORDER — LIDOCAINE HCL (PF) 1 % IJ SOLN: 30.0000 mL | INTRAMUSCULAR | Status: DC | PRN

## 2021-12-31 MED ORDER — ACETAMINOPHEN 325 MG PO TABS: 650.0000 mg | ORAL_TABLET | ORAL | Status: DC | PRN

## 2021-12-31 NOTE — H&P (Signed)
Meredith [name] is a 31 y.o. female presenting for Induction of labor for Gestational Diabetes A2 controlled with Metformin..Her OB history is remarkable for well controlled GDM, history of C/s followed by VBAC x2, and a 15 week demise.  SHe has had preeclampsia in a prior pregnancy, normotensive this pregnancy. She is also rubella nonimmune.  Had Foley inserted in office, came out last night.  Office Note: She is currently monitored for the following issues for this high-risk pregnancy and has Hx of cesarean section complicating pregnancy; Fetal demise at [redacted] weeks gestation; Hx of preeclampsia, prior pregnancy, currently pregnant; Supervision of high risk pregnancy, antepartum; Obesity in pregnancy, antepartum; GDM, class A1; History of VBAC; and Rubella non-immune status, antepartum on their problem list.  OB History     Gravida  8   Para  4   Term  3   Preterm      AB  3   Living  3      SAB  3   IAB      Ectopic      Multiple  0   Live Births  3        Obstetric Comments  5 month loss  02/26/18 preg is listed as a "para" in error, this should be an SAB.... IUFD at 15wk; unable to change in computer With first preg, was induced for high blood pressure,? Reason for c/s - did get to compete and pushed        Past Medical History:  Diagnosis Date   COVID-19 06/08/2019   recovered at home per patient   Gestational diabetes    Past Surgical History:  Procedure Laterality Date   CESAREAN SECTION  04/29/2011   Procedure: CESAREAN SECTION;  Surgeon: Roseanna Rainbow, MD;  Location: [facility];  Service: Gynecology;  Laterality: N/A;  Primary Cesarian Section   Family History: family history includes Cancer in her maternal grandmother; Healthy in her father and mother. Social History:  reports that she has quit smoking. Her smoking use included cigars and cigarettes. She smoked an average of .25 packs per day. She has never used smokeless tobacco. She reports that she  does not currently use alcohol. She reports that she does not use drugs.     Maternal Diabetes: Yes:  Diabetes Type:  Insulin/Medication controlled Genetic Screening: Normal Maternal Ultrasounds/Referrals: Normal Fetal Ultrasounds or other Referrals:  None Maternal Substance Abuse:  No Significant Maternal Medications:  Meds include: Other:  Metformin Significant Maternal Lab Results:  Group B Strep negative Other Comments:  None  Review of Systems  Constitutional:  Negative for chills and fever.  Respiratory:  Negative for shortness of breath.   Gastrointestinal:  Negative for abdominal pain and nausea.  Genitourinary:  Negative for pelvic pain and vaginal bleeding.  Neurological:  Negative for weakness.   Maternal Medical History:  Reason for admission: Nausea. Induction of labor for GDM A2  Contractions: Frequency: regular.   Perceived severity is mild.   Fetal activity: Perceived fetal activity is normal.   Prenatal complications: No bleeding, PIH, placental abnormality or pre-eclampsia.   Prenatal Complications - Diabetes: gestational. Diabetes is managed by oral agent (monotherapy).     Dilation: 3.5 Effacement (%): Thick Exam by:: Sheilah Pigeon, RN Blood pressure 134/85, pulse 83, temperature 98.2 F (36.8 C), temperature source Oral, height 5\' 3"  (1.6 m), weight 125.2 kg. Maternal Exam:  Uterine Assessment: Contraction strength is mild.  Contraction frequency is irregular.  Abdomen: Patient reports no abdominal tenderness.  Fetal presentation: vertex Introitus: Normal vulva. Normal vagina.  Ferning test: not done.  Nitrazine test: not done. Pelvis: adequate for delivery.   Cervix: Cervix evaluated by digital exam.     Fetal Exam Fetal Monitor Review: Mode: ultrasound.   Baseline rate: 145.  Variability: moderate (6-25 bpm).   Pattern: accelerations present and no decelerations.   Fetal State Assessment: Category I - tracings are normal.   Physical  Exam Constitutional:      General: She is not in acute distress.    Appearance: She is not ill-appearing or toxic-appearing.  HENT:     Head: Normocephalic.  Cardiovascular:     Rate and Rhythm: Normal rate.  Pulmonary:     Effort: Pulmonary effort is normal.  Abdominal:     General: There is no distension.     Tenderness: There is no abdominal tenderness. There is no guarding.  Genitourinary:    General: Normal vulva.     Comments: Dilation: 3.5 Effacement (%): Thick Cervical Position: Posterior Station: -3 Presentation: Vertex (Verified by Toney Sang, CNM via [place]) Exam by:: Sheilah Pigeon, RN  Musculoskeletal:        General: Normal range of motion.     Cervical back: Normal range of motion.  Skin:    General: Skin is warm and dry.  Neurological:     General: No focal deficit present.     Mental Status: She is alert.  Psychiatric:        Mood and Affect: Mood normal.        Behavior: Behavior normal.     Prenatal labs: ABO, Rh: --/--/O POS (07/19 2055) Antibody: NEG (07/19 2055) Rubella: <0.90 (05/30 1108) RPR: NON REACTIVE (05/30 1108)  HBsAg: NON REACTIVE (05/30 1108)  HIV: Non Reactive (05/30 1108)  GBS: Negative/-- (07/05 1453)   Assessment/Plan: Single IUP at [redacted]w[redacted]d Gestational Diabetes, A2 Induction of labor  Admit to Labor and Delivery Start Pitocin per protocol since Foley has come out.  Anticipate SVD CBG q4h   Wynelle Bourgeois 12/31/2021, 10:02 PM

## 2021-12-31 NOTE — Progress Notes (Signed)
Pt states OP Foley bulb came out 7/18 at home around 2200.

## 2021-12-31 NOTE — Progress Notes (Signed)
Patient ID: Meredith [name], female   DOB: [birth year], 31 y.o.   MRN: [id number] Pt informed that the ultrasound is considered a limited OB ultrasound and is not intended to be a complete ultrasound exam.  Patient also informed that the ultrasound is not being completed with the intent of assessing for fetal or placental anomalies or any pelvic abnormalities.  Explained that the purpose of today's ultrasound is to assess for presentation, BPP and amniotic fluid volume.  Patient acknowledges the purpose of the exam and the limitations of the study.    Vertex by ultrasound

## 2022-01-01 ENCOUNTER — Inpatient Hospital Stay (HOSPITAL_COMMUNITY): Payer: Medicaid Other | Admitting: Anesthesiology

## 2022-01-01 ENCOUNTER — Encounter (HOSPITAL_COMMUNITY)
Admission: AD | Disposition: A | Discharge status: Home / Self Care | Payer: Self-pay | Source: Home / Self Care | Attending: Obstetrics and Gynecology

## 2022-01-01 ENCOUNTER — Encounter (HOSPITAL_COMMUNITY): Payer: Self-pay | Admitting: Family Medicine

## 2022-01-01 DIAGNOSIS — E669 Obesity, unspecified: Secondary | ICD-10-CM

## 2022-01-01 DIAGNOSIS — O135 Gestational [pregnancy-induced] hypertension without significant proteinuria, complicating the puerperium: Secondary | ICD-10-CM

## 2022-01-01 DIAGNOSIS — Z8759 Personal history of other complications of pregnancy, childbirth and the puerperium: Secondary | ICD-10-CM

## 2022-01-01 DIAGNOSIS — Z3A39 39 weeks gestation of pregnancy: Secondary | ICD-10-CM

## 2022-01-01 DIAGNOSIS — O24424 Gestational diabetes mellitus in childbirth, insulin controlled: Secondary | ICD-10-CM

## 2022-01-01 DIAGNOSIS — O139 Gestational [pregnancy-induced] hypertension without significant proteinuria, unspecified trimester: Secondary | ICD-10-CM | POA: Diagnosis not present

## 2022-01-01 DIAGNOSIS — O09292 Supervision of pregnancy with other poor reproductive or obstetric history, second trimester: Secondary | ICD-10-CM

## 2022-01-01 DIAGNOSIS — Z98891 History of uterine scar from previous surgery: Secondary | ICD-10-CM

## 2022-01-01 DIAGNOSIS — O34211 Maternal care for low transverse scar from previous cesarean delivery: Secondary | ICD-10-CM

## 2022-01-01 DIAGNOSIS — O24429 Gestational diabetes mellitus in childbirth, unspecified control: Secondary | ICD-10-CM

## 2022-01-01 DIAGNOSIS — O99214 Obesity complicating childbirth: Secondary | ICD-10-CM

## 2022-01-01 DIAGNOSIS — Z6841 Body Mass Index (BMI) 40.0 and over, adult: Secondary | ICD-10-CM

## 2022-01-01 HISTORY — DX: Personal history of other complications of pregnancy, childbirth and the puerperium: Z87.59

## 2022-01-01 LAB — GLUCOSE, CAPILLARY
Glucose-Capillary: 71 mg/dL (ref 70–99)
Glucose-Capillary: 71 mg/dL (ref 70–99)
Glucose-Capillary: 75 mg/dL (ref 70–99)
Glucose-Capillary: 76 mg/dL (ref 70–99)
Glucose-Capillary: 85 mg/dL (ref 70–99)

## 2022-01-01 LAB — RPR: RPR Ser Ql: NONREACTIVE

## 2022-01-01 SURGERY — Surgical Case
Anesthesia: Epidural

## 2022-01-01 MED ORDER — HYDROMORPHONE HCL 1 MG/ML IJ SOLN: 0.2500 mg | INTRAMUSCULAR | Status: DC | PRN

## 2022-01-01 MED ORDER — PHENYLEPHRINE 80 MCG/ML (10ML) SYRINGE FOR IV PUSH (FOR BLOOD PRESSURE SUPPORT)
PREFILLED_SYRINGE | INTRAVENOUS | Status: AC
  Filled 2022-01-01: qty 10

## 2022-01-01 MED ORDER — MORPHINE SULFATE (PF) 0.5 MG/ML IJ SOLN
INTRAMUSCULAR | Status: AC
  Filled 2022-01-01: qty 10

## 2022-01-01 MED ORDER — KETOROLAC TROMETHAMINE 30 MG/ML IJ SOLN: 30.0000 mg | Freq: Once | INTRAMUSCULAR | Status: DC | PRN

## 2022-01-01 MED ORDER — MORPHINE SULFATE (PF) 10 MG/ML IV SOLN: INTRAVENOUS | Status: DC | PRN

## 2022-01-01 MED ORDER — SODIUM CHLORIDE 0.9 % IV SOLN
INTRAVENOUS | Status: DC | PRN
  Administered 2022-01-01: 500 mg via INTRAVENOUS

## 2022-01-01 MED ORDER — LIDOCAINE HCL (PF) 1 % IJ SOLN
INTRAMUSCULAR | Status: DC | PRN
  Administered 2022-01-01: 5 mL via EPIDURAL

## 2022-01-01 MED ORDER — FENTANYL CITRATE (PF) 100 MCG/2ML IJ SOLN: 100.0000 ug | INTRAMUSCULAR | Status: DC | PRN

## 2022-01-01 MED ORDER — ONDANSETRON HCL 4 MG/2ML IJ SOLN
INTRAMUSCULAR | Status: DC | PRN
  Administered 2022-01-01: 4 mg via INTRAVENOUS

## 2022-01-01 MED ORDER — OXYTOCIN-SODIUM CHLORIDE 30-0.9 UT/500ML-% IV SOLN
2.5000 [IU]/h | INTRAVENOUS | Status: AC
  Administered 2022-01-02: 2.5 [IU]/h via INTRAVENOUS
  Filled 2022-01-01: qty 500

## 2022-01-01 MED ORDER — SIMETHICONE 80 MG PO CHEW
80.0000 mg | CHEWABLE_TABLET | Freq: Three times a day (TID) | ORAL | Status: DC
  Administered 2022-01-02 – 2022-01-03 (×3): 80 mg via ORAL
  Filled 2022-01-01 (×3): qty 1

## 2022-01-01 MED ORDER — LACTATED RINGERS IV SOLN: 500.0000 mL | Freq: Once | INTRAVENOUS | Status: DC

## 2022-01-01 MED ORDER — FENTANYL-BUPIVACAINE-NACL 0.5-0.125-0.9 MG/250ML-% EP SOLN
EPIDURAL | Status: DC | PRN
  Administered 2022-01-01: 12 mL/h via EPIDURAL

## 2022-01-01 MED ORDER — PHENYLEPHRINE HCL (PRESSORS) 10 MG/ML IV SOLN
INTRAVENOUS | Status: DC | PRN
  Administered 2022-01-01 (×5): 80 ug via INTRAVENOUS

## 2022-01-01 MED ORDER — PRENATAL MULTIVITAMIN CH
1.0000 | ORAL_TABLET | Freq: Every day | ORAL | Status: DC
  Administered 2022-01-02 – 2022-01-03 (×2): 1 via ORAL
  Filled 2022-01-01 (×2): qty 1

## 2022-01-01 MED ORDER — CEFAZOLIN SODIUM-DEXTROSE 2-3 GM-%(50ML) IV SOLR
INTRAVENOUS | Status: DC | PRN
  Administered 2022-01-01: 2 g via INTRAVENOUS

## 2022-01-01 MED ORDER — OXYTOCIN-SODIUM CHLORIDE 30-0.9 UT/500ML-% IV SOLN
INTRAVENOUS | Status: AC
  Filled 2022-01-01: qty 1000

## 2022-01-01 MED ORDER — ENOXAPARIN SODIUM 40 MG/0.4ML IJ SOSY: 40.0000 mg | PREFILLED_SYRINGE | INTRAMUSCULAR | Status: DC

## 2022-01-01 MED ORDER — COCONUT OIL OIL: 1.0000 | TOPICAL_OIL | Status: DC | PRN

## 2022-01-01 MED ORDER — SUCCINYLCHOLINE CHLORIDE 200 MG/10ML IV SOSY
PREFILLED_SYRINGE | INTRAVENOUS | Status: AC
  Filled 2022-01-01: qty 10

## 2022-01-01 MED ORDER — LIDOCAINE-EPINEPHRINE (PF) 2 %-1:200000 IJ SOLN
INTRAMUSCULAR | Status: DC | PRN
  Administered 2022-01-01: 5 mL via EPIDURAL

## 2022-01-01 MED ORDER — PHENYLEPHRINE 80 MCG/ML (10ML) SYRINGE FOR IV PUSH (FOR BLOOD PRESSURE SUPPORT): 80.0000 ug | PREFILLED_SYRINGE | INTRAVENOUS | Status: DC | PRN

## 2022-01-01 MED ORDER — EPHEDRINE 5 MG/ML INJ: 10.0000 mg | INTRAVENOUS | Status: DC | PRN

## 2022-01-01 MED ORDER — DIBUCAINE (PERIANAL) 1 % EX OINT: 1.0000 | TOPICAL_OINTMENT | CUTANEOUS | Status: DC | PRN

## 2022-01-01 MED ORDER — DIPHENHYDRAMINE HCL 50 MG/ML IJ SOLN: 12.5000 mg | INTRAMUSCULAR | Status: DC | PRN

## 2022-01-01 MED ORDER — FENTANYL-BUPIVACAINE-NACL 0.5-0.125-0.9 MG/250ML-% EP SOLN
12.0000 mL/h | EPIDURAL | Status: DC | PRN
  Filled 2022-01-01: qty 250

## 2022-01-01 MED ORDER — SIMETHICONE 80 MG PO CHEW: 80.0000 mg | CHEWABLE_TABLET | ORAL | Status: DC | PRN

## 2022-01-01 MED ORDER — ENOXAPARIN SODIUM 60 MG/0.6ML IJ SOSY
60.0000 mg | PREFILLED_SYRINGE | INTRAMUSCULAR | Status: DC
  Administered 2022-01-02: 60 mg via SUBCUTANEOUS
  Filled 2022-01-01: qty 0.6

## 2022-01-01 MED ORDER — STERILE WATER FOR IRRIGATION IR SOLN
Status: DC | PRN
  Administered 2022-01-01: 1

## 2022-01-01 MED ORDER — MIDAZOLAM HCL 2 MG/2ML IJ SOLN
INTRAMUSCULAR | Status: AC
  Filled 2022-01-01: qty 2

## 2022-01-01 MED ORDER — OXYCODONE-ACETAMINOPHEN 5-325 MG PO TABS: 1.0000 | ORAL_TABLET | ORAL | Status: DC | PRN

## 2022-01-01 MED ORDER — DIPHENHYDRAMINE HCL 25 MG PO CAPS: 25.0000 mg | ORAL_CAPSULE | Freq: Four times a day (QID) | ORAL | Status: DC | PRN

## 2022-01-01 MED ORDER — MIDAZOLAM HCL 5 MG/5ML IJ SOLN
INTRAMUSCULAR | Status: DC | PRN
  Administered 2022-01-01: 2 mg via INTRAVENOUS

## 2022-01-01 MED ORDER — SODIUM CHLORIDE 0.9 % IV SOLN
INTRAVENOUS | Status: AC
  Filled 2022-01-01: qty 5

## 2022-01-01 MED ORDER — TETANUS-DIPHTH-ACELL PERTUSSIS 5-2.5-18.5 LF-MCG/0.5 IM SUSY: 0.5000 mL | PREFILLED_SYRINGE | Freq: Once | INTRAMUSCULAR | Status: DC

## 2022-01-01 MED ORDER — SENNOSIDES-DOCUSATE SODIUM 8.6-50 MG PO TABS
2.0000 | ORAL_TABLET | Freq: Every day | ORAL | Status: DC
  Administered 2022-01-02 – 2022-01-03 (×2): 2 via ORAL
  Filled 2022-01-01 (×2): qty 2

## 2022-01-01 MED ORDER — MEPERIDINE HCL 25 MG/ML IJ SOLN: 6.2500 mg | INTRAMUSCULAR | Status: DC | PRN

## 2022-01-01 MED ORDER — SUCCINYLCHOLINE CHLORIDE 200 MG/10ML IV SOSY
PREFILLED_SYRINGE | INTRAVENOUS | Status: DC | PRN
  Administered 2022-01-01: 10 mg via INTRAVENOUS
  Administered 2022-01-01: 140 mg via INTRAVENOUS

## 2022-01-01 MED ORDER — PROMETHAZINE HCL 25 MG/ML IJ SOLN: 6.2500 mg | INTRAMUSCULAR | Status: DC | PRN

## 2022-01-01 MED ORDER — DEXAMETHASONE SODIUM PHOSPHATE 10 MG/ML IJ SOLN
INTRAMUSCULAR | Status: AC
  Filled 2022-01-01: qty 1

## 2022-01-01 MED ORDER — PROPOFOL 10 MG/ML IV BOLUS
INTRAVENOUS | Status: DC | PRN
  Administered 2022-01-01: 200 mg via INTRAVENOUS

## 2022-01-01 MED ORDER — ONDANSETRON HCL 4 MG/2ML IJ SOLN
INTRAMUSCULAR | Status: AC
  Filled 2022-01-01: qty 2

## 2022-01-01 MED ORDER — MENTHOL 3 MG MT LOZG: 1.0000 | LOZENGE | OROMUCOSAL | Status: DC | PRN

## 2022-01-01 MED ORDER — IBUPROFEN 600 MG PO TABS
600.0000 mg | ORAL_TABLET | Freq: Four times a day (QID) | ORAL | Status: DC
  Administered 2022-01-02 – 2022-01-03 (×5): 600 mg via ORAL
  Filled 2022-01-01 (×6): qty 1

## 2022-01-01 MED ORDER — WITCH HAZEL-GLYCERIN EX PADS
1.0000 | MEDICATED_PAD | CUTANEOUS | Status: DC | PRN
Start: 2022-01-01 — End: 2022-01-03

## 2022-01-01 MED ORDER — MORPHINE SULFATE (PF) 0.5 MG/ML IJ SOLN
INTRAMUSCULAR | Status: DC | PRN
  Administered 2022-01-01: 2 mg via EPIDURAL
  Administered 2022-01-01: 3 mg via EPIDURAL

## 2022-01-01 SURGICAL SUPPLY — 32 items
CHLORAPREP W/TINT 26ML (MISCELLANEOUS) ×4 IMPLANT
CLAMP CORD UMBIL (MISCELLANEOUS) ×2 IMPLANT
DRSG OPSITE POSTOP 4X10 (GAUZE/BANDAGES/DRESSINGS) ×2 IMPLANT
ELECT REM PT RETURN 9FT ADLT (ELECTROSURGICAL) ×2
ELECTRODE REM PT RTRN 9FT ADLT (ELECTROSURGICAL) ×1 IMPLANT
EXTRACTOR VACUUM M CUP 4 TUBE (SUCTIONS) IMPLANT
GAUZE SPONGE 4X4 12PLY STRL LF (GAUZE/BANDAGES/DRESSINGS) ×2 IMPLANT
GLOVE BIOGEL PI IND STRL 6.5 (GLOVE) ×1 IMPLANT
GLOVE BIOGEL PI IND STRL 7.0 (GLOVE) ×1 IMPLANT
GLOVE BIOGEL PI INDICATOR 6.5 (GLOVE) ×1
GLOVE BIOGEL PI INDICATOR 7.0 (GLOVE) ×1
GLOVE SURG SS PI 6.5 STRL IVOR (GLOVE) ×2 IMPLANT
GOWN STRL REUS W/TWL LRG LVL3 (GOWN DISPOSABLE) ×4 IMPLANT
HEMOSTAT ARISTA ABSORB 3G PWDR (HEMOSTASIS) ×1 IMPLANT
KIT ABG SYR 3ML LUER SLIP (SYRINGE) ×1 IMPLANT
NDL HYPO 25X5/8 SAFETYGLIDE (NEEDLE) IMPLANT
NEEDLE HYPO 25X5/8 SAFETYGLIDE (NEEDLE) ×2 IMPLANT
NS IRRIG 1000ML POUR BTL (IV SOLUTION) ×2 IMPLANT
PACK C SECTION WH (CUSTOM PROCEDURE TRAY) ×2 IMPLANT
PAD ABD 7.5X8 STRL (GAUZE/BANDAGES/DRESSINGS) ×1 IMPLANT
PAD OB MATERNITY 4.3X12.25 (PERSONAL CARE ITEMS) ×2 IMPLANT
RTRCTR C-SECT PINK 25CM LRG (MISCELLANEOUS) IMPLANT
SUT PLAIN 0 NONE (SUTURE) IMPLANT
SUT VIC AB 0 CT1 36 (SUTURE) ×8 IMPLANT
SUT VIC AB 0 CTX 36 (SUTURE) ×6
SUT VIC AB 0 CTX36XBRD ANBCTRL (SUTURE) IMPLANT
SUT VIC AB 2-0 CT1 27 (SUTURE) ×4
SUT VIC AB 2-0 CT1 TAPERPNT 27 (SUTURE) IMPLANT
SUT VIC AB 4-0 KS 27 (SUTURE) ×2 IMPLANT
TOWEL OR 17X24 6PK STRL BLUE (TOWEL DISPOSABLE) ×2 IMPLANT
TRAY FOLEY W/BAG SLVR 14FR LF (SET/KITS/TRAYS/PACK) ×2 IMPLANT
WATER STERILE IRR 1000ML POUR (IV SOLUTION) ×2 IMPLANT

## 2022-01-01 NOTE — Discharge Summary (Signed)
   Postpartum Discharge Summary    Patient Name: Meredith [name] DOB: 10/25/1990 MRN: [id number]  Date of admission: 12/31/2021 Delivery date:01/01/2022  Delivering provider: CONSTANT, PEGGY  Date of discharge: 01/03/2022  Admitting diagnosis: Encounter for induction of labor [Z34.90] Intrauterine pregnancy: [redacted]w[redacted]d     Secondary diagnosis:  Principal Problem:   Status post repeat low transverse cesarean section Active Problems:   Hx of cesarean section complicating pregnancy   Hx of preeclampsia, prior pregnancy, currently pregnant   Supervision of high risk pregnancy, antepartum   Obesity in pregnancy, antepartum   History of VBAC   Rubella non-immune status, antepartum   GDM, class A2   Encounter for induction of labor   Umbilical cord prolapse   Gestational hypertension  Additional problems: None    Discharge diagnosis: Term Pregnancy Delivered                                              Post partum procedures:  None Augmentation: AROM, Pitocin, and OP Foley Complications: Cord prolapse   Hospital course: Induction of Labor With Cesarean Section   31 y.o. yo [id number] at [redacted]w[redacted]d was admitted to the hospital 12/31/2021 for induction of labor/TOLAC due to A2GDM.  Patient was augmented with an outpatient foley balloon, Pitocin, and AROM.  The patient went for cesarean section due to Cord Prolapse.  Delivery details are as follows: Membrane Rupture Time/Date: 7:25 PM ,01/01/2022   Delivery Method:C-Section, Vacuum Assisted  Details of operation can be found in separate operative note.  Patient had an uncomplicated postpartum course.  Her hemoglobin was 8.8 and POD#1, for which she received IV Venofer.  She was asymptomatic from this while inpatient.  Her fasting CBG was 122.  She will have a GTT at her postpartum visit.  Her BP remained within normal limits postpartum with a single isolated mild range BP.  She was started on Lasix, which she will continue upon discharge to complete a 5  day course.  She will have a BP check in 1 week.  She is ambulating, tolerating a regular diet, passing flatus, and urinating well.  Her pain and bleeding are controlled.  She is formula feeding well.  Patient is discharged home in stable condition on 01/03/22.      Newborn Data: Birth date:01/01/2022  Birth time:8:42 PM  Gender:Female  Living status:Living  Apgars:1 ,8  Weight:3500 g                                Magnesium Sulfate received: No BMZ received: No Rhophylac: N/A MMR: Offered postpartum  T-DaP: Offered postpartum  Flu: No Transfusion: No  Physical exam  Vitals:   01/02/22 2303 01/03/22 0542 01/03/22 0630 01/03/22 1232  BP: 127/80 (!) 140/94 134/80 124/72  Pulse: 90 86 96 (!) 102  Resp: 18 16    Temp: 98.5 F (36.9 C) 97.9 F (36.6 C)    TempSrc: Oral Oral    SpO2:      Weight:      Height:       General: alert, cooperative, and no distress Lochia: appropriate Uterine Fundus: firm Incision: pressure dressing in place, clean, dry, and intact  DVT Evaluation: 1+ pitting edema to lower extremities bilaterally, no calf tenderness to palpation   Labs: Lab Results  Component Value Date     WBC 17.1 (H) 01/02/2022   HGB 8.8 (L) 01/02/2022   HCT 27.5 (L) 01/02/2022   MCV 73.5 (L) 01/02/2022   PLT 195 01/02/2022      Latest Ref Rng & Units 01/02/2022    5:47 AM  CMP  Creatinine 0.44 - 1.00 mg/dL 0.46    Edinburgh Score:    01/02/2022    8:13 AM  Edinburgh Postnatal Depression Scale Screening Tool  I have been able to laugh and see the funny side of things. 0  I have looked forward with enjoyment to things. 0  I have blamed myself unnecessarily when things went wrong. 1  I have been anxious or worried for no good reason. 0  I have felt scared or panicky for no good reason. 1  Things have been getting on top of me. 0  I have been so unhappy that I have had difficulty sleeping. 0  I have felt sad or miserable. 0  I have been so unhappy that I have been  crying. 0  The thought of harming myself has occurred to me. 0  Edinburgh Postnatal Depression Scale Total 2    After visit meds:  Allergies as of 01/03/2022   No Known Allergies      Medication List     TAKE these medications    acetaminophen 500 MG tablet Commonly known as: TYLENOL Take 2 tablets (1,000 mg total) by mouth every 8 (eight) hours as needed (pain).   furosemide 20 MG tablet Commonly known as: LASIX Take 1 tablet (20 mg total) by mouth daily for 4 days. Start taking on: January 04, 2022   ibuprofen 600 MG tablet Commonly known as: ADVIL Take 1 tablet (600 mg total) by mouth every 6 (six) hours as needed (pain).   oxyCODONE 5 MG immediate release tablet Commonly known as: Roxicodone Take 1 tablet (5 mg total) by mouth every 6 (six) hours as needed for severe pain or breakthrough pain.               Discharge Care Instructions  (From admission, onward)           Start     Ordered   01/03/22 0000  Discharge wound care:       Comments: Remove dressing in 3 days. You can then wash the area gently with soap and water when in the shower and pat dry well. You will have an incision check in about 1 week.   01/03/22 1259            Discharge home in stable condition Infant Feeding: Bottle Infant Disposition: home with mother Discharge instruction: per After Visit Summary and Postpartum booklet. Activity: Advance as tolerated. Pelvic rest for 6 weeks.  Diet: routine diet Future Appointments: Future Appointments  Date Time Provider Department Center  01/09/2022 10:00 AM [facility] NURSE [facility]  02/13/2022  8:20 AM [facility] LAB [facility]  02/13/2022 10:15 AM Smith, [place], CNM [facility]   Follow up Visit: Message sent to [facility] by Dr. Albert on 01/01/22.   Please schedule this patient for a In person postpartum visit in 6 weeks with the following provider: Any provider. Additional Postpartum F/U: 2 hour GTT, Incision check 1 week, and BP check  1 week  High risk pregnancy complicated by: gHTN intrapartum, GDMA2, hx of CS x 1  Delivery mode:  C-Section, Vacuum Assisted  Anticipated Birth Control:  Unsure  01/03/2022 Christina M Albert, MD    

## 2022-01-01 NOTE — Progress Notes (Signed)
Labor Progress Note Meredith [name] is a 31 y.o. [phone] at [redacted]w[redacted]d who presented for IOL/TOLAC due to A2GDM.  S: Doing well. Comfortable with epidural. No concerns.   O:  BP 124/73   Pulse 88   Temp 98.2 F (36.8 C) (Oral)   Resp 16   Ht 5\' 3"  (1.6 m)   Wt 125.2 kg   LMP  (LMP Unknown)   SpO2 97%   BMI 48.89 kg/m   EFM: Baseline 135 bpm, moderate variability, + accels, no decels  Toco: Every 2-4 minutes   CVE: Dilation: 3.5 Effacement (%): 50 Cervical Position: Posterior Station: Ballotable Presentation: Vertex Exam by:: Dr. [email or web address]  A&P: 31 y.o. 37 [redacted]w[redacted]d   #Labor: Progressing well. AROM discussed and patient verbally consented. AROM performed with moderate amount of clear fluid. Mom and baby tolerated this well. Will continue Pitocin and titrate to achieve adequate pattern. Will reassess in 4 hours, sooner as needed.  #Pain: Epidural  #FWB: Cat 1  #GBS negative  #A2GDM: CBGs within normal range. Continue every 4 hour glucose checks in latent phase.   [redacted]w[redacted]d, MD 3:38 PM

## 2022-01-01 NOTE — Progress Notes (Signed)
Patient ID: Meredith [name], female   DOB: 1990-07-07, 31 y.o.   MRN: [id number] Doing well, feeling some pain  Vitals:   01/01/22 0210 01/01/22 0232 01/01/22 0300 01/01/22 0332  BP: (!) 141/89 (!) 141/80 140/87 138/86  Pulse: 82 71 77 74  Resp: 16 16 16    Temp:   97.8 F (36.6 C)   TempSrc:   Oral   Weight:      Height:       FHR reactive UCs every 2 min  Dilation: 3.5 Effacement (%): 50 Cervical Position: Posterior Station: Ballotable Presentation: Vertex Exam by:: [email or web address], CNM  Continue plan of care

## 2022-01-01 NOTE — Progress Notes (Signed)
Labor Progress Note Meredith [name] is a 31 y.o. [phone] at [redacted]w[redacted]d who presented for IOL/TOLAC due to A2GDM.   S: Doing well. Starting to feel contractions. No concerns.   O:  BP 138/70   Pulse 78   Temp 98.2 F (36.8 C) (Oral)   Resp 16   Ht 5\' 3"  (1.6 m)   Wt 125.2 kg   LMP  (LMP Unknown)   BMI 48.89 kg/m   EFM: Baseline 135 bpm, moderate variability, + accels, no decels  Toco: Every 2-4 minutes   CVE: Dilation: 3.5 Effacement (%): 50 Cervical Position: Posterior Station: Ballotable Presentation: Vertex Exam by:: [email or web address], CNM  A&P: 31 y.o. 37 [redacted]w[redacted]d   #Labor: Progressing well. Starting to feel contractions, every 2-4 minutes on tocometer. Considering Epidural placement soon. Will plan to recheck after that time and assess for AROM on next exam. All questions and concerns addressed.  #Pain: PRN; planning for epidural  #FWB: Cat 1  #GBS negative  #A2GDM: CBGs at goal. Will continue to monitor every 4 hours in latent phase.   [redacted]w[redacted]d, MD 9:55 AM

## 2022-01-01 NOTE — Anesthesia Procedure Notes (Addendum)
Procedure Name: Intubation Date/Time: 01/01/2022 8:33 PM  Performed by: Lewie Loron, MDPre-anesthesia Checklist: Patient identified, Emergency Drugs available, Suction available and Patient being monitored Patient Re-evaluated:Patient Re-evaluated prior to induction Oxygen Delivery Method: Circle System Utilized Preoxygenation: Pre-oxygenation with 100% oxygen Induction Type: IV induction Ventilation: Mask ventilation without difficulty Laryngoscope Size: Glidescope and 3 Grade View: Grade II Tube type: Oral Tube size: 7.0 mm Number of attempts: 1 Airway Equipment and Method: Stylet and Oral airway Placement Confirmation: ETT inserted through vocal cords under direct vision, positive ETCO2 and breath sounds checked- equal and bilateral Secured at: 21 cm Tube secured with: Tape Dental Injury: Teeth and Oropharynx as per pre-operative assessment  Difficulty Due To: Difficult Airway-  due to edematous airway Comments: Intubation per Dr. Renold Don

## 2022-01-01 NOTE — Anesthesia Procedure Notes (Signed)
Epidural Patient location during procedure: OB Start time: 01/01/2022 11:30 AM End time: 01/01/2022 11:52 AM  Staffing Anesthesiologist: Trevor Iha, MD Performed: anesthesiologist   Preanesthetic Checklist Completed: patient identified, IV checked, site marked, risks and benefits discussed, surgical consent, monitors and equipment checked, pre-op evaluation and timeout performed  Epidural Patient position: sitting Prep: DuraPrep and site prepped and draped Patient monitoring: continuous pulse ox and blood pressure Approach: midline Location: L3-L4 Injection technique: LOR air  Needle:  Needle type: Tuohy  Needle gauge: 17 G Needle length: 9 cm and 9 Needle insertion depth: 9 cm Catheter type: closed end flexible Catheter size: 19 Gauge Catheter at skin depth: 15 cm Test dose: negative  Assessment Events: blood not aspirated, injection not painful, no injection resistance, no paresthesia and negative IV test  Additional Notes Patient identified. Risks/Benefits/Options discussed with patient including but not limited to bleeding, infection, nerve damage, paralysis, failed block, incomplete pain control, headache, blood pressure changes, nausea, vomiting, reactions to medication both or allergic, itching and postpartum back pain. Confirmed with bedside nurse the patient's most recent platelet count. Confirmed with patient that they are not currently taking any anticoagulation, have any bleeding history or any family history of bleeding disorders. Patient expressed understanding and wished to proceed. All questions were answered. Sterile technique was used throughout the entire procedure. Please see nursing notes for vital signs. Test dose was given through epidural needle and negative prior to continuing to dose epidural or start infusion. Warning signs of high block given to the patient including shortness of breath, tingling/numbness in hands, complete motor block, or any  concerning symptoms with instructions to call for help. Patient was given instructions on fall risk and not to get out of bed. All questions and concerns addressed with instructions to call with any issues.  3 Attempt (S) . Patient tolerated procedure well.

## 2022-01-01 NOTE — Progress Notes (Addendum)
Labor Progress Note Meredith [name] is a 31 y.o. [phone] at [redacted]w[redacted]d who presented for IOL/TOLAC due to A2GDM.  S: Having mild pelvic discomfort otherwise doing well.    O:  BP 130/62   Pulse 80   Temp 97.9 F (36.6 C) (Axillary)   Resp 15   Ht 5\' 3"  (1.6 m)   Wt 125.2 kg   LMP  (LMP Unknown)   SpO2 97%   BMI 48.89 kg/m   EFM: Intermittent 2/2 maternal body habitus but overall reassuring.- Baseline 130 bpm, moderate variability, + accels, no decels  Toco: Every 3 minutes   CVE: Dilation: 3.5 Effacement (%): 50 Cervical Position: Posterior Station: Ballotable Presentation: Vertex Exam by:: Dr. [email or web address]  A&P: 31 y.o. 37 106w1d   #Labor: Grossly unchanged. Evidence of fore bag which was AROM at the time with additional clear fluid. Infant head now well engaged with cervix. IUPC introduced to assess efficacy of contractions. Will continue to monitor for change with pitocin currently at 18 mU/min. Consider increasing pitocin with unchanged at next exam.  #Pain: Epidural  #FWB: Cat 1  #GBS negative  #A2GDM: CBGs within normal range. Continue every 4 hour glucose checks in latent phase.   Antwine Agosto Autry-Lott, DO 7:37 PM

## 2022-01-01 NOTE — Progress Notes (Signed)
Patient ID: Meredith [name], female   DOB: 09-Nov-1990, 31 y.o.   MRN: [id number] Called to room with report of a prolapsed cord when RN was placing an FSE  De Constant notified and arrived immediately  Patient taken to OR for Code Cesarean

## 2022-01-01 NOTE — Transfer of Care (Signed)
Immediate Anesthesia Transfer of Care Note  Patient: Ahjanae Cassel  Procedure(s) Performed: CESAREAN SECTION  Patient Location: PACU  Anesthesia Type:General  Level of Consciousness: awake, alert , oriented and patient cooperative  Airway & Oxygen Therapy: Patient Spontanous Breathing and Patient connected to nasal cannula oxygen  Post-op Assessment: Report given to RN and Post -op Vital signs reviewed and stable  Post vital signs: Reviewed and stable  Last Vitals:  Vitals Value Taken Time  BP 125/76 01/01/22 2150  Temp    Pulse 102 01/01/22 2200  Resp 18 01/01/22 2200  SpO2 99 % 01/01/22 2200  Vitals shown include unvalidated device data.  Last Pain:  Vitals:   01/01/22 1930  TempSrc:   PainSc: 0-No pain         Complications: No notable events documented.

## 2022-01-01 NOTE — Op Note (Signed)
Meredith [name] PROCEDURE DATE: 12/31/2021 - 01/01/2022  PREOPERATIVE DIAGNOSIS: Intrauterine pregnancy at  [redacted]w[redacted]d weeks gestation;  cord polapse  POSTOPERATIVE DIAGNOSIS: The same  PROCEDURE:     Cesarean Section  SURGEON:  Dr. Catalina Antigua  ASSISTANT: Dr. Mathis Fare  INDICATIONS: Meredith [name] is a 31 y.o. [id number] at [redacted]w[redacted]d scheduled for cesarean section secondary to cord prolapse.  The risks of cesarean section discussed with the patient included but were not limited to: bleeding which may require transfusion or reoperation; infection which may require antibiotics; injury to bowel, bladder, ureters or other surrounding organs; injury to the fetus; need for additional procedures including hysterectomy in the event of a life-threatening hemorrhage; placental abnormalities wth subsequent pregnancies, incisional problems, thromboembolic phenomenon and other postoperative/anesthesia complications. The patient concurred with the proposed plan, giving informed written consent for the procedure.    FINDINGS:  Viable female infant in cephalic presentation.  Apgars 1 and 9, weight, 7 pounds and 11 ounces.  Clear amniotic fluid.  Intact placenta, three vessel cord.  Normal uterus, fallopian tubes and ovaries bilaterally.  ANESTHESIA:    General INTRAVENOUS FLUIDS:1200 ml ESTIMATED BLOOD LOSS: 492 ml URINE OUTPUT:  600 ml SPECIMENS: Placenta sent to L&D COMPLICATIONS: None immediate  PROCEDURE IN DETAIL:  The patient received intravenous antibiotics and had sequential compression devices applied to her lower extremities while in the preoperative area.  She was then taken to the operating room where anesthesia was induced and was found to be adequate. A foley catheter was placed into her bladder and attached to Dianelly Ferran gravity. She was then placed in a dorsal supine position with a leftward tilt, and prepped and draped in a sterile manner. After an adequate timeout was performed, a Pfannenstiel skin incision  was made with scalpel and carried through to the underlying layer of fascia. The fascia was incised in the midline and this incision was extended bilaterally bluntly.  The rectus muscles were separated in the midline bluntly and the peritoneum was entered bluntly. The Alexis self-retaining retractor was introduced into the abdominal cavity. Attention was turned to the lower uterine segment where a transverse hysterotomy was made with a scalpel and extended bilaterally bluntly. The infant was successfully delivered with the assistance of a vacuum with 3 pop off. The cord was clamped and cut and infant was handed over to awaiting neonatology team. Uterine massage was then administered and the placenta delivered intact with three-vessel cord. The uterus was cleared of clot and debris.  The hysterotomy was closed with 0 Vicryl in a running locked fashion, and an imbricating layer was also placed with a 0 Vicryl. Overall, excellent hemostasis was noted. Hemostasis was confirmed on all surfaces.  The peritoneum and the muscles were reapproximated using 0 vicryl interrupted stitches. The fascia was then closed using 0 Vicryl in a running fashion.  The subcutaneous layer was reapproximated with plain gut and the skin was closed in a subcuticular fashion using 3.0 Vicryl. The patient tolerated the procedure well. Sponge, lap, instrument and needle counts were correct x 2. She was taken to the recovery room in stable condition.    Una Yeomans ConstantMD  01/01/2022 9:28 PM

## 2022-01-01 NOTE — Anesthesia Preprocedure Evaluation (Addendum)
Anesthesia Evaluation  Patient identified by MRN, date of birth, ID band Patient awake    Reviewed: Allergy & Precautions, NPO status , Patient's Chart, lab work & pertinent test resultsPreop documentation limited or incomplete due to emergent nature of procedure.  Airway Mallampati: III  TM Distance: >3 FB Neck ROM: Full    Dental no notable dental hx. (+) Dental Advisory Given, Teeth Intact   Pulmonary former smoker,    Pulmonary exam normal breath sounds clear to auscultation       Cardiovascular Exercise Tolerance: Good Normal cardiovascular exam Rhythm:Regular Rate:Normal     Neuro/Psych    GI/Hepatic negative GI ROS,   Endo/Other  diabetes, Gestational  Renal/GU      Musculoskeletal   Abdominal (+) + obese (BMI 48.9),   Peds  Hematology  (+) Blood dyscrasia, anemia , Lab Results      Component                Value               Date                      WBC                      9.2                 12/31/2021                HGB                      8.8 (L)             12/31/2021                HCT                      28.5 (L)            12/31/2021                MCV                      75.0 (L)            12/31/2021                PLT                      224                 12/31/2021              Anesthesia Other Findings   Reproductive/Obstetrics (+) Pregnancy                           Anesthesia Physical Anesthesia Plan  ASA: 3  Anesthesia Plan: General   Post-op Pain Management: Ofirmev IV (intra-op)*   Induction: Intravenous, Rapid sequence and Cricoid pressure planned  PONV Risk Score and Plan: 4 or greater and Ondansetron, Dexamethasone, Treatment may vary due to age or medical condition and Midazolam  Airway Management Planned: Oral ETT and Video Laryngoscope Planned  Additional Equipment:   Intra-op Plan:   Post-operative Plan: Extubation in OR  Informed  Consent: I have reviewed the patients History and Physical, chart, labs and discussed the procedure including the risks, benefits and alternatives  for the proposed anesthesia with the patient or authorized representative who has indicated his/her understanding and acceptance.     Dental advisory given  Plan Discussed with: CRNA  Anesthesia Plan Comments:       Anesthesia Quick Evaluation

## 2022-01-02 ENCOUNTER — Encounter (HOSPITAL_COMMUNITY): Payer: Self-pay | Admitting: Family Medicine

## 2022-01-02 LAB — CBC
HCT: 27.5 % — ABNORMAL LOW (ref 36.0–46.0)
Hemoglobin: 8.8 g/dL — ABNORMAL LOW (ref 12.0–15.0)
MCH: 23.5 pg — ABNORMAL LOW (ref 26.0–34.0)
MCHC: 32 g/dL (ref 30.0–36.0)
MCV: 73.5 fL — ABNORMAL LOW (ref 80.0–100.0)
Platelets: 195 10*3/uL (ref 150–400)
RBC: 3.74 MIL/uL — ABNORMAL LOW (ref 3.87–5.11)
RDW: 14.9 % (ref 11.5–15.5)
WBC: 17.1 10*3/uL — ABNORMAL HIGH (ref 4.0–10.5)
nRBC: 0 % (ref 0.0–0.2)

## 2022-01-02 LAB — GLUCOSE, CAPILLARY: Glucose-Capillary: 122 mg/dL — ABNORMAL HIGH (ref 70–99)

## 2022-01-02 LAB — CREATININE, SERUM
Creatinine, Ser: 0.46 mg/dL (ref 0.44–1.00)
GFR, Estimated: >60 mL/min (ref >60)

## 2022-01-02 MED ORDER — IRON SUCROSE 20 MG/ML IV SOLN
500.0000 mg | Freq: Once | INTRAVENOUS | Status: AC
  Administered 2022-01-02: 500 mg via INTRAVENOUS
  Filled 2022-01-02: qty 25

## 2022-01-02 MED ORDER — SCOPOLAMINE 1 MG/3DAYS TD PT72
1.0000 | MEDICATED_PATCH | TRANSDERMAL | Status: DC
  Administered 2022-01-02: 1.5 mg via TRANSDERMAL
  Filled 2022-01-02: qty 1

## 2022-01-02 MED ORDER — ONDANSETRON HCL 4 MG/2ML IJ SOLN
4.0000 mg | Freq: Four times a day (QID) | INTRAMUSCULAR | Status: DC | PRN
  Administered 2022-01-02: 4 mg via INTRAVENOUS
  Filled 2022-01-02: qty 2

## 2022-01-02 MED ORDER — ACETAMINOPHEN 325 MG PO TABS
650.0000 mg | ORAL_TABLET | Freq: Four times a day (QID) | ORAL | Status: DC | PRN
  Administered 2022-01-02: 650 mg via ORAL
  Filled 2022-01-02: qty 2

## 2022-01-02 NOTE — Progress Notes (Signed)
POSTPARTUM PROGRESS NOTE  Subjective: Meredith [name] is a 31 y.o. [id number] s/p LTCS at [redacted]w[redacted]d.  She reports she's doing well. No acute events overnight, but had some nausea and vomiting this morning. She denies any problems with ambulating, voiding or po intake. She has passed flatus. Pain is well controlled.  Lochia is normal.  Objective: Blood pressure 127/75, pulse 75, temperature 98 F (36.7 C), resp. rate 18, height 5\' 3"  (1.6 m), weight 125.2 kg, SpO2 99 %, unknown if currently breastfeeding.  Physical Exam:  General: alert, cooperative and no distress Chest: no respiratory distress Abdomen: soft, non-tender  Uterine Fundus: firm and at level of umbilicus Extremities: No calf swelling or tenderness  no edema  Recent Labs    12/31/21 2050 01/02/22 0547  HGB 8.8* 8.8*  HCT 28.5* 27.5*    Assessment/Plan: Kaytlen Lightsey is a 31 y.o. 37 s/p LTCS at [redacted]w[redacted]d for cord prolapse.  Routine Postpartum Care: Doing well, pain well-controlled.  -- Continue routine care, lactation support  -- Contraception: considering BTL -- Feeding: Bottle and Breast   #Acute blood loss anemia Hgb 8.8 this morning, stable from admission. Patient asymptomatic. Wll give IV Venofer  - IV venofer 500 mg x 1  Dispo: Plan for discharge tomorrow.  [redacted]w[redacted]d, MD Faculty Practice, Center for [facility] 01/02/2022 9:47 AM

## 2022-01-02 NOTE — Anesthesia Postprocedure Evaluation (Signed)
Anesthesia Post Note  Patient: Meredith [name]  Procedure(s) Performed: CESAREAN SECTION     Patient location during evaluation: PACU Anesthesia Type: General Level of consciousness: patient cooperative and awake and alert Pain management: pain level controlled Vital Signs Assessment: post-procedure vital signs reviewed and stable Respiratory status: spontaneous breathing Cardiovascular status: stable Anesthetic complications: no   No notable events documented.  Last Vitals:  Vitals:   01/02/22 0135 01/02/22 0235  BP: 131/88 129/79  Pulse: 74 81  Resp: 18 18  Temp: 36.6 C 36.8 C  SpO2: 96% 96%    Last Pain:  Vitals:   01/02/22 0235  TempSrc: Oral  PainSc:    Pain Goal:                   Lewie Loron

## 2022-01-02 NOTE — Lactation Note (Signed)
This note was copied from a baby's chart. Lactation Consultation Note  Patient Name: Meredith [name] [id number] Date: 01/02/2022 Reason for consult: Initial assessment;Term Age:30 hours   P4 mother whose infant is now 53 hours old.  This is a term baby at 39+1 weeks.  Mother's current feeding preference is breast/formula.  RN requested latch assistance.  Baby "Meredith [name]"  was swaddled and asleep in the bassinet when I arrived.  Offered to assist with breast feeding; mother receptive.  Taught hand expression and colostrum drops finger fed to "Meredith [name]."  Attempted to latch in the football hold to the left breast, however, he was too sleepy to open him mouth.  Placed him STS on mother's chest and he fell asleep.  Encouraged mother to feed 8-12 times/24 hours or with feeding cues.  Suggested lots of STS, breast massage and hand expression.  Offered to return as needed for latch assistance.  Mother does not currently participate in [facility].  Provided the phone number for her to call for an appointment.  She does not have an electric pump but expressed an interest in obtaining one if possible.  Father present and asleep on the couch.     Maternal Data Has patient been taught Hand Expression?: Yes Does the patient have breastfeeding experience prior to this delivery?: Yes How long did the patient breastfeed?: 2-5 months with each of her other children  Feeding Mother's Current Feeding Choice: Breast Milk and Formula  LATCH Score                    Lactation Tools Discussed/Used    Interventions Interventions: Breast feeding basics reviewed;Assisted with latch;Skin to skin;Breast massage;Hand express;Position options;Support pillows;Adjust position;Education;LC Services brochure  Discharge Pump: [facility] (May contact [facility] about an electric pump if desired) WIC Program: No (Mother plans to apply)  Consult Status Consult Status: Follow-up Date: 01/03/22 Follow-up type:  In-patient    Kweku Stankey R Makalia Bare 01/02/2022, 7:24 AM

## 2022-01-02 NOTE — Progress Notes (Signed)
Patient has been nauseated and throwing up. However she states she feels better after emesis. Patient offered nausea meds and declined at this time. Patient desires to eat. Patient denies any pain at this time. Foley removed . Patient encouraged to drink fluids and call for help to the bathroom. MD told about fasting sugar of 122 and was ok with the value. Patient and FOB shown how to bulb suction infant . Infant is spitty, emergency bell explained if infant turns blue. Patient told to latch first before formula but patient doing both. No other concerns. Incenctive spriometer shown.

## 2022-01-03 ENCOUNTER — Encounter (HOSPITAL_COMMUNITY): Payer: Self-pay | Admitting: Obstetrics and Gynecology

## 2022-01-03 MED ORDER — FUROSEMIDE 20 MG PO TABS: 20.0000 mg | ORAL_TABLET | Freq: Every day | ORAL | 0 refills | Status: AC

## 2022-01-03 MED ORDER — FUROSEMIDE 20 MG PO TABS
20.0000 mg | ORAL_TABLET | Freq: Every day | ORAL | Status: DC
  Administered 2022-01-03: 20 mg via ORAL
  Filled 2022-01-03: qty 1

## 2022-01-03 MED ORDER — IBUPROFEN 600 MG PO TABS: 600.0000 mg | ORAL_TABLET | Freq: Four times a day (QID) | ORAL | 0 refills | Status: AC | PRN

## 2022-01-03 MED ORDER — MEASLES, MUMPS & RUBELLA VAC IJ SOLR
0.5000 mL | INTRAMUSCULAR | Status: AC | PRN
  Administered 2022-01-03: 0.5 mL via SUBCUTANEOUS

## 2022-01-03 MED ORDER — ACETAMINOPHEN 500 MG PO TABS: 1000.0000 mg | ORAL_TABLET | Freq: Three times a day (TID) | ORAL | 0 refills | Status: AC | PRN

## 2022-01-03 MED ORDER — OXYCODONE HCL 5 MG PO TABS: 5.0000 mg | ORAL_TABLET | Freq: Four times a day (QID) | ORAL | 0 refills | Status: AC | PRN

## 2022-01-03 NOTE — Lactation Note (Signed)
This note was copied from a baby's chart. Lactation Consultation Note  Patient Name: Meredith [name] [id number]'[id number] Date: 01/03/2022 Reason for consult: Follow-up assessment;Infant weight loss;Term;Breastfeeding assistance (4.43% WL) Age:30 hours  P4, Term, Infant Female, 4.43% WL  LC entered the room and baby was asleep in the bassinet. Per mom, baby has been doing well at the breast. Mom states that baby is latching deeply and she denies any pain. Mom says that this is the first time she has breast fed without pain.   LC spoke to mom about outpatient services, engorgement, warning signs, and infant I/O.   Mom states that she is formula feeding and breastfeeding because she feels as if baby is still hungry after breastfeeding.   Current Feeding Plan:  Breastfeed 8+ times in 24 hours according to infant feeding cues.  Put baby to the breast prior to supplementing with formula.  Supplement according to supplementation guidelines.  Watch infant output and call the pediatrician with questions or concerns.  Contact outpatient LC for assistance with breastfeeding.   Feeding Nipple Type: Slow - flow  Interventions Interventions: Breast feeding basics reviewed;Education  Discharge Discharge Education: Engorgement and breast care;Warning signs for feeding baby;Outpatient recommendation  Consult Status Consult Status: Complete Date: 01/03/22 Follow-up type: In-patient    Meredith [name] 01/03/2022, 1:19 PM

## 2022-01-07 ENCOUNTER — Encounter: Payer: Self-pay | Admitting: Obstetrics and Gynecology

## 2022-01-09 ENCOUNTER — Ambulatory Visit (INDEPENDENT_AMBULATORY_CARE_PROVIDER_SITE_OTHER): Payer: Self-pay | Admitting: *Deleted

## 2022-01-09 ENCOUNTER — Other Ambulatory Visit: Payer: Self-pay

## 2022-01-09 VITALS — BP 136/85 | HR 100 | Ht 63.0 in | Wt 245.0 lb

## 2022-01-09 DIAGNOSIS — Z4889 Encounter for other specified surgical aftercare: Secondary | ICD-10-CM

## 2022-01-09 DIAGNOSIS — Z0130 Encounter for examination of blood pressure without abnormal findings: Secondary | ICD-10-CM

## 2022-01-09 NOTE — Progress Notes (Signed)
Here for nurse visit for Incision check and BP check s/p repeat C/S 01/01/22 complicated by GHTN, GDM. BP 136/85, has completed lasix. Incision CDI , no redness, no edema, no discharge noted. Reviewed wound care and postpartum appointment and 2 hr gtt appointments with patient. She voices understanding. Nancy Fetter

## 2022-01-11 ENCOUNTER — Encounter (HOSPITAL_COMMUNITY): Payer: Self-pay | Admitting: Obstetrics and Gynecology

## 2022-01-11 NOTE — Addendum Note (Signed)
Addendum  created 01/11/22 2257 by Lewie Loron, MD   Intraprocedure Event edited

## 2022-01-14 ENCOUNTER — Encounter: Payer: Self-pay | Admitting: Family Medicine

## 2022-01-14 ENCOUNTER — Other Ambulatory Visit: Payer: Self-pay

## 2022-02-05 ENCOUNTER — Other Ambulatory Visit: Payer: Self-pay

## 2022-02-05 DIAGNOSIS — O24419 Gestational diabetes mellitus in pregnancy, unspecified control: Secondary | ICD-10-CM

## 2022-02-05 DIAGNOSIS — O24429 Gestational diabetes mellitus in childbirth, unspecified control: Secondary | ICD-10-CM

## 2022-02-11 NOTE — Addendum Note (Signed)
Addended by: Jill Side on: 02/11/2022 08:44 AM   Modules accepted: Orders

## 2022-02-13 ENCOUNTER — Encounter: Payer: Self-pay | Admitting: Advanced Practice Midwife

## 2022-02-13 ENCOUNTER — Other Ambulatory Visit: Payer: Self-pay

## 2022-02-13 ENCOUNTER — Ambulatory Visit (INDEPENDENT_AMBULATORY_CARE_PROVIDER_SITE_OTHER): Payer: Self-pay | Admitting: Advanced Practice Midwife

## 2022-02-13 VITALS — BP 125/72 | HR 83 | Wt 242.0 lb

## 2022-02-13 DIAGNOSIS — Z30011 Encounter for initial prescription of contraceptive pills: Secondary | ICD-10-CM

## 2022-02-13 DIAGNOSIS — O24429 Gestational diabetes mellitus in childbirth, unspecified control: Secondary | ICD-10-CM

## 2022-02-13 DIAGNOSIS — O24415 Gestational diabetes mellitus in pregnancy, controlled by oral hypoglycemic drugs: Secondary | ICD-10-CM

## 2022-02-13 DIAGNOSIS — Z8759 Personal history of other complications of pregnancy, childbirth and the puerperium: Secondary | ICD-10-CM

## 2022-02-13 DIAGNOSIS — O34219 Maternal care for unspecified type scar from previous cesarean delivery: Secondary | ICD-10-CM

## 2022-02-13 LAB — POCT PREGNANCY, URINE: Preg Test, Ur: NEGATIVE

## 2022-02-13 MED ORDER — NORETHINDRONE 0.35 MG PO TABS: 1.0000 | ORAL_TABLET | Freq: Every day | ORAL | 12 refills | Status: AC

## 2022-02-13 NOTE — Progress Notes (Unsigned)
Post Partum Visit Note  Meredith [name] is a 31 y.o. [phone] female who presents for a postpartum visit. She is 6 weeks postpartum following a repeat cesarean section for cord prolapse.  I have fully reviewed the prenatal and intrapartum course. The delivery was at [redacted]w[redacted]d gestational weeks.  Anesthesia: general. Postpartum course has been uncomplicated. Baby is doing well. Baby is feeding by breastmilk and bottle - similac . Bleeding no bleeding. Bowel function is normal. Bladder function is normal. Patient is sexually active. Contraception method was condoms. Plans to get IUD after Medicaid gets approved. Requests POP bridge.  Postpartum depression screening: negative.   The pregnancy intention screening data noted above was reviewed. Potential methods of contraception were discussed. The patient elected to proceed with No data recorded.   Edinburgh Postnatal Depression Scale - 02/13/22 1043       Edinburgh Postnatal Depression Scale:  In the Past 7 Days   I have been able to laugh and see the funny side of things. 0    I have looked forward with enjoyment to things. 0    I have blamed myself unnecessarily when things went wrong. 0    I have been anxious or worried for no good reason. 0    I have felt scared or panicky for no good reason. 0    Things have been getting on top of me. 0    I have been so unhappy that I have had difficulty sleeping. 0    I have felt sad or miserable. 0    I have been so unhappy that I have been crying. 0    The thought of harming myself has occurred to me. 0    Edinburgh Postnatal Depression Scale Total 0             Health Maintenance Due  Topic Date Due   COVID-19 Vaccine (1) Never done   PAP-Cervical Cytology Screening  02/18/2019   INFLUENZA VACCINE  01/13/2022    The following portions of the patient's history were reviewed and updated as appropriate: allergies, current medications, past family history, past medical history, past social  history, past surgical history, and problem list.  Review of Systems Pertinent items are noted in HPI.  Objective:  BP 125/72   Pulse 83   Wt 242 lb (109.8 kg)   LMP  (LMP Unknown)   Breastfeeding Yes   BMI 42.87 kg/m    General:  alert, cooperative, appears stated age, no distress, and moderately obese   Breasts:  Declined  Lungs: Nml rate and effort  Heart:  Nml rate   Abdomen: soft, non-tender; bowel sounds normal; no masses,  no organomegaly   Wound well approximated incision  GU exam:  not indicated       Assessment:    There are no diagnoses linked to this encounter.  *** postpartum exam.   Plan:   Essential components of care per ACOG recommendations:  1.  Mood and well being: Patient with negative depression screening today. Reviewed local resources for support.  - Patient tobacco use? No.   - hx of drug use? No.    2. Infant care and feeding:  -Patient currently breastmilk feeding? Yes. Reviewed importance of draining breast regularly to support lactation.  -Social determinants of health (SDOH) reviewed in EPIC. No concerns***The following needs were identified***  3. Sexuality, contraception and birth spacing - Patient {DOES_DOES IDP:82423} want a pregnancy in the next year.  Desired family size is {NUMBER  1-44:31540} children.  - Reviewed reproductive life planning. Reviewed contraceptive methods based on pt preferences and effectiveness.  Patient desired {Upstream End Methods:24109} today.   - Discussed birth spacing of 18 months  4. Sleep and fatigue -Encouraged family/partner/community support of 4 hrs of uninterrupted sleep to help with mood and fatigue  5. Physical Recovery  - Discussed patients delivery and complications. She describes her labor as good. - Patient had a C-section emergent. Patient had a  NA  laceration. Perineal healing reviewed. Patient expressed understanding - Patient has urinary incontinence? No. - Patient is safe to resume  physical and sexual activity  6.  Health Maintenance - HM due items addressed No - *** - Last pap smear No results found for: "DIAGPAP" Pap smear not done at today's visit.  -Breast Cancer screening indicated? No.   7. Chronic Disease/Pregnancy Condition follow up: {Follow up:25499}  - PCP follow up  Dorathy Kinsman, CNM Center for [employer], [facility]

## 2022-02-13 NOTE — Patient Instructions (Signed)
AREA FAMILY PRACTICE PHYSICIANS  [place] ([address]) [facility] [address] [place], [place] [address] [phone] Mon-Fri 8:30-12:30, 1:30-5:00 Accepting Medicaid [facility] [address], [place], [place] [address] [phone] Mon-Fri 8:00-5:30 [facility] [address] [place], [place] [address] [phone] Mon, Tue, Thur, Fri 8:30-5:00, Wed 10:00-7:00 (closed 1-2pm) Accepting Medicaid [facility] [address], [place], [place]  [address] Phone - [phone]   Fax - [phone]  [place] ([address]) [facility] [address] [place], [place] [address] [phone] Mon-Fri 8:00-5:00 [facility] ([facility])  [address] [place], [place] [address] [phone] Mon-Fri 8:30-5:30, Sat (Oct.-Mar.) 9:00-1:00 Accepting Medicaid  [place] ([address]) [facility] [address], [place], [place] [address] [phone] Mon-Fri 8:00-5:00  [place] ([address]) [facility] [address], [place], [place] [address] [phone] Mon-Fri 8:00-5:00 [facility] [address], [place], [place] [address] [phone] Mon-Fri 8:00-5:00 [facility] [address] [place], [place] [address] [phone] Mon-Fri 8:00-5:00 [facility] [address] [place] [address] [phone] Mon-Fri 7:30-5:30  [place] ([address] & [address]) [facility] [address] [place], [place] [address] [phone] Mon-Thur 8:00-6:00 Accepting Medicaid [facility] [address] [place], [place] [address] [phone] Mon-Thur 7:30-7:30, Fri 7:30-4:30 Accepting  Medicaid [facility] [address], [place], [place]  [address] [phone]   Fax - [phone]  [place] ([address] & [address]) [facility] [address] [place], [place] [address] [phone] Mon-Fri 7:00-5:00 [facility] [address], [place], [place] [address] [phone] Mon-Fri 8:00-5:00 Accepting Medicaid [facility] [address], [place], [place] [address] [phone] Mon-Fri 8:00-5:00 Accepting Medicaid  [place] ([address]) [facility] [place] [address] [place], [place] [address] [phone] Mon-Fri 8:00-5:00 [facility] [address], [place], [place] [address] [phone] Mon-Fri 8:00-5:00 Accepting Medicaid [facility] ([facility]) [address], [place], [place] [address] [phone] Mon-Fri 8:00-5:30, Sat&Sun by appointment (phones open at 8:30) Accepting Medicaid  [place] ([address]) [facility] [address] [place], [place] [address] [phone] Mon-Thur 8:00-7:00, Fri 8:00-5:00, Sat 8:00-12:00, Sun 9:00-12:00 Accepting Medicaid [facility] [address], [place], [place] [address] [phone] Mon-Thur 8:00-5:00 Accepting Medicaid [facility] [address] [place], [place] [address] [phone] Mon-Fri 8:00-5:30, Sat (Oct.-Mar.) 9:00-1:00 Accepting Medicaid  [place] ([address]) [facility] [address], [place], [place] [address] [phone] Mon-Fri 8:00-5:00 Accepting Medicaid   [place] ([address]) [facility] [place] [address], [place], [place] [address] [phone] Mon-Fri 8:00-5:00 [facility] [place] [address], [place], [place] [address] [phone] Mon-Fri 8:00-5:00 [facility] [place] [address] [place] [address], [place],  [address] [phone] Mon-Fri 8:00-5:00 After hours clinic ([address] [place],  [address]) [phone] Mon-Fri 5:00-8:00, Sat 12:00-6:00, Sun 10:00-4:00 Accepting Medicaid [facility] [place]   [address], [place], [place]  [address] [phone]   Fax - [phone]  [place] ([address]) [facility] [place] [facility] [address], [place], [place] [address] [phone] Mon-Fri 8:00-5:00 [facility] [place] ([facility] [place]) [address], [place], [place] [address] [phone] Mon-Thur 8:00-7:00, Fri 8:00-5:00, Sat 8:00-12:00    

## 2022-02-14 LAB — GLUCOSE TOLERANCE, 2 HOURS
Glucose, 2 hour: 98 mg/dL (ref 70–139)
Glucose, GTT - Fasting: 93 mg/dL (ref 70–99)

## 2022-02-16 ENCOUNTER — Encounter: Payer: Self-pay | Admitting: Advanced Practice Midwife

## 2022-02-18 ENCOUNTER — Other Ambulatory Visit: Payer: Self-pay

## 2022-02-18 ENCOUNTER — Emergency Department (HOSPITAL_BASED_OUTPATIENT_CLINIC_OR_DEPARTMENT_OTHER): Payer: Medicaid Other

## 2022-02-18 ENCOUNTER — Encounter (HOSPITAL_BASED_OUTPATIENT_CLINIC_OR_DEPARTMENT_OTHER): Payer: Self-pay | Admitting: Urology

## 2022-02-18 DIAGNOSIS — F410 Panic disorder [episodic paroxysmal anxiety] without agoraphobia: Secondary | ICD-10-CM | POA: Insufficient documentation

## 2022-02-18 DIAGNOSIS — Z5321 Procedure and treatment not carried out due to patient leaving prior to being seen by health care provider: Secondary | ICD-10-CM | POA: Insufficient documentation

## 2022-02-18 DIAGNOSIS — R079 Chest pain, unspecified: Secondary | ICD-10-CM | POA: Diagnosis not present

## 2022-02-18 LAB — BASIC METABOLIC PANEL
Anion gap: 8 (ref 5–15)
BUN: 16 mg/dL (ref 6–20)
CO2: 25 mmol/L (ref 22–32)
Calcium: 9.2 mg/dL (ref 8.9–10.3)
Chloride: 105 mmol/L (ref 98–111)
Creatinine, Ser: 0.73 mg/dL (ref 0.44–1.00)
GFR, Estimated: >60 mL/min (ref >60)
Glucose, Bld: 106 mg/dL — ABNORMAL HIGH (ref 70–99)
Potassium: 4.1 mmol/L (ref 3.5–5.1)
Sodium: 138 mmol/L (ref 135–145)

## 2022-02-18 LAB — PREGNANCY, URINE: Preg Test, Ur: NEGATIVE

## 2022-02-18 LAB — CBC
HCT: 37.8 % (ref 36.0–46.0)
Hemoglobin: 11.8 g/dL — ABNORMAL LOW (ref 12.0–15.0)
MCH: 24.4 pg — ABNORMAL LOW (ref 26.0–34.0)
MCHC: 31.2 g/dL (ref 30.0–36.0)
MCV: 78.1 fL — ABNORMAL LOW (ref 80.0–100.0)
Platelets: 298 10*3/uL (ref 150–400)
RBC: 4.84 MIL/uL (ref 3.87–5.11)
RDW: 19.2 % — ABNORMAL HIGH (ref 11.5–15.5)
WBC: 8.6 10*3/uL (ref 4.0–10.5)
nRBC: 0 % (ref 0.0–0.2)

## 2022-02-18 LAB — TROPONIN I (HIGH SENSITIVITY): Troponin I (High Sensitivity): <2 ng/L (ref <18)

## 2022-02-18 NOTE — ED Triage Notes (Signed)
Pt states "feel like Im having an anxiety attack"  also reports breastfeeding. 6 week postpartum States left sided chest pain that started 2 days ago, worse with arm movement

## 2022-02-19 ENCOUNTER — Emergency Department (HOSPITAL_BASED_OUTPATIENT_CLINIC_OR_DEPARTMENT_OTHER)
Admission: EM | Admit: 2022-02-19 | Discharge: 2022-02-19 | Disposition: A | Discharge status: Home / Self Care | Payer: Medicaid Other | Attending: Emergency Medicine | Admitting: Emergency Medicine

## 2022-02-19 ENCOUNTER — Other Ambulatory Visit: Payer: Self-pay

## 2022-02-19 ENCOUNTER — Encounter (HOSPITAL_BASED_OUTPATIENT_CLINIC_OR_DEPARTMENT_OTHER): Payer: Self-pay

## 2022-02-19 DIAGNOSIS — Z8616 Personal history of COVID-19: Secondary | ICD-10-CM | POA: Insufficient documentation

## 2022-02-19 DIAGNOSIS — Z5321 Procedure and treatment not carried out due to patient leaving prior to being seen by health care provider: Secondary | ICD-10-CM

## 2022-02-19 DIAGNOSIS — R0789 Other chest pain: Secondary | ICD-10-CM | POA: Insufficient documentation

## 2022-02-19 MED ORDER — DICLOFENAC SODIUM 1 % EX GEL: 2.0000 g | Freq: Four times a day (QID) | CUTANEOUS | 0 refills | Status: AC | PRN

## 2022-02-19 NOTE — Discharge Instructions (Signed)

## 2022-02-19 NOTE — ED Provider Notes (Addendum)
Per nursing notes, Apparently patient left without being seen by a provider.    Meredith [name], Barbara Cower, MD 02/19/22 [phone]

## 2022-02-19 NOTE — ED Provider Notes (Signed)
Emergency Department Provider Note   I have reviewed the triage vital signs and the nursing notes.   HISTORY  Chief Complaint Chest Pain   HPI Meredith [name] is a 31 y.o. female presents to the ED with CP. Pain is present only with movement, mainly of the left arm. No SOB. No fever or chills. No exertional pain or rest pain. No clear injury but does have a physical job.    Past Medical History:  Diagnosis Date   COVID-19 06/08/2019   recovered at home per patient   GDM, class A1    GDM, class A1    GDM, class A2    GDM, class A2    Gestational diabetes    History of gestational hypertension 01/01/2022   History of pre-eclampsia 11/11/2021    Review of Systems  Constitutional: No fever/chills Eyes: No visual changes. ENT: No sore throat. Cardiovascular: Positive chest pain. Respiratory: Denies shortness of breath. Gastrointestinal: No abdominal pain.  No nausea, no vomiting.  No diarrhea.  No constipation. Genitourinary: Negative for dysuria. Musculoskeletal: Negative for back pain. Skin: Negative for rash. Neurological: Negative for headaches, focal weakness or numbness.  ____________________________________________   PHYSICAL EXAM:  VITAL SIGNS: ED Triage Vitals  Enc Vitals Group     BP 02/19/22 0821 137/86     Pulse Rate 02/19/22 0821 82     Resp 02/19/22 0821 18     Temp 02/19/22 0821 98.6 F (37 C)     Temp Source 02/19/22 0821 Oral     SpO2 02/19/22 0821 100 %     Weight 02/19/22 0823 241 lb (109.3 kg)     Height 02/19/22 0823 5\' 3"  (1.6 m)   Constitutional: Alert and oriented. Well appearing and in no acute distress. Eyes: Conjunctivae are normal.  Head: Atraumatic. Nose: No congestion/rhinnorhea. Mouth/Throat: Mucous membranes are moist.   Neck: No stridor.  Cardiovascular: Normal rate, regular rhythm. Good peripheral circulation. Grossly normal heart sounds.   Respiratory: Normal respiratory effort.  No retractions. Lungs  CTAB. Gastrointestinal: Soft and nontender. No distention.  Musculoskeletal: No lower extremity tenderness nor edema. No gross deformities of extremities. Neurologic:  Normal speech and language. No gross focal neurologic deficits are appreciated.  Skin:  Skin is warm, dry and intact. No rash noted.   ____________________________________________   PROCEDURES  Procedure(s) performed:   Procedures   ____________________________________________   INITIAL IMPRESSION / ASSESSMENT AND PLAN / ED COURSE  Pertinent labs & imaging results that were available during my care of the patient were reviewed by me and considered in my medical decision making (see chart for details).   This patient is Presenting for Evaluation of CP, which does require a range of treatment options, and is a complaint that involves a high risk of morbidity and mortality.  The Differential Diagnoses includes but is not exclusive to acute coronary syndrome, aortic dissection, pulmonary embolism, cardiac tamponade, community-acquired pneumonia, pericarditis, musculoskeletal chest wall pain, etc.  Medical Decision Making: Summary:  Patient presents to the ED with CP. Pain is reproducible by report and on my exam. Plan for [facility] pain mgmt and work note. Exceedingly low suspicion for ACS/PE or other emergent CP etiologies.   Disposition: discharge  ____________________________________________  FINAL CLINICAL IMPRESSION(S) / ED DIAGNOSES  Final diagnoses:  Chest wall pain     NEW OUTPATIENT MEDICATIONS STARTED DURING THIS VISIT:  Discharge Medication List as of 02/19/2022  9:03 AM     START taking these medications   Details  diclofenac Sodium (VOLTAREN) 1 % GEL Apply 2 g topically 4 (four) times daily as needed., Starting Thu 02/19/2022, Normal        Note:  This document was prepared using Dragon voice recognition software and may include unintentional dictation errors.  Alona Bene, MD, [facility] Emergency  Medicine    Nyema Hachey, Arlyss Repress, MD 02/25/22 [phone]

## 2022-02-19 NOTE — ED Notes (Signed)
Called for x 3 in waiting room with no answer

## 2022-02-19 NOTE — ED Triage Notes (Signed)
Pt states she is having some chest pain that started this week. States the pain only hurts when she moves her left arm across her chest. Pt also c/o anxiety. Pt denies any chest pain or sob currently.

## 2022-06-11 ENCOUNTER — Encounter (HOSPITAL_BASED_OUTPATIENT_CLINIC_OR_DEPARTMENT_OTHER): Payer: Self-pay

## 2022-06-11 ENCOUNTER — Other Ambulatory Visit: Payer: Self-pay

## 2022-06-11 ENCOUNTER — Emergency Department (HOSPITAL_BASED_OUTPATIENT_CLINIC_OR_DEPARTMENT_OTHER)
Admission: EM | Admit: 2022-06-11 | Discharge: 2022-06-11 | Disposition: A | Discharge status: Home / Self Care | Payer: Medicaid Other | Attending: Emergency Medicine | Admitting: Emergency Medicine

## 2022-06-11 DIAGNOSIS — Z1152 Encounter for screening for COVID-19: Secondary | ICD-10-CM | POA: Insufficient documentation

## 2022-06-11 DIAGNOSIS — Z79899 Other long term (current) drug therapy: Secondary | ICD-10-CM | POA: Insufficient documentation

## 2022-06-11 DIAGNOSIS — Z87891 Personal history of nicotine dependence: Secondary | ICD-10-CM | POA: Diagnosis not present

## 2022-06-11 DIAGNOSIS — R000 Tachycardia, unspecified: Secondary | ICD-10-CM | POA: Insufficient documentation

## 2022-06-11 DIAGNOSIS — I10 Essential (primary) hypertension: Secondary | ICD-10-CM | POA: Diagnosis not present

## 2022-06-11 DIAGNOSIS — R519 Headache, unspecified: Secondary | ICD-10-CM | POA: Diagnosis present

## 2022-06-11 DIAGNOSIS — G43909 Migraine, unspecified, not intractable, without status migrainosus: Secondary | ICD-10-CM | POA: Insufficient documentation

## 2022-06-11 LAB — RESP PANEL BY RT-PCR (RSV, FLU A&B, COVID)  RVPGX2
Influenza A by PCR: NEGATIVE
Influenza B by PCR: NEGATIVE
Resp Syncytial Virus by PCR: NEGATIVE
SARS Coronavirus 2 by RT PCR: NEGATIVE

## 2022-06-11 MED ORDER — VITAMIN B-6 25 MG PO TABS: 25.0000 mg | ORAL_TABLET | Freq: Four times a day (QID) | ORAL | 0 refills | Status: AC | PRN

## 2022-06-11 MED ORDER — SODIUM CHLORIDE 0.9 % IV BOLUS
500.0000 mL | Freq: Once | INTRAVENOUS | Status: AC
  Administered 2022-06-11: 500 mL via INTRAVENOUS

## 2022-06-11 MED ORDER — KETOROLAC TROMETHAMINE 30 MG/ML IJ SOLN
15.0000 mg | Freq: Once | INTRAMUSCULAR | Status: AC
  Administered 2022-06-11: 15 mg via INTRAVENOUS
  Filled 2022-06-11: qty 1

## 2022-06-11 MED ORDER — METOCLOPRAMIDE HCL 5 MG/ML IJ SOLN
10.0000 mg | Freq: Once | INTRAMUSCULAR | Status: AC
  Administered 2022-06-11: 10 mg via INTRAVENOUS
  Filled 2022-06-11: qty 2

## 2022-06-11 NOTE — ED Provider Notes (Signed)
[facility] EMERGENCY DEPARTMENT Provider Note   CSN: [id number] Arrival date & time: 06/11/22  1222     History  Chief Complaint  Patient presents with   Headache       Meredith [name] is a 31 y.o. female.   Headache Otalgia Associated symptoms: headaches     31 year old presents to the emergency department complaints of headache.  Patient reports headache beginning yesterday around crown of her head.  Reports response to ibuprofen yesterday mildly.  She reports recurrence of headache this morning upon awakening with Tylenol in the decrease in intensity.  She notes again increase around 10 AM this morning prompting her visit to the emergency department.  Reports history of migraines as a kid but has not had severe headaches recently.  Reports gradual onset with worsening intensity since onset.  Denies visual disturbance, gait abnormalities, weakness/sensory deficits, slurred speech, drooping of the face.  Denies chest pain, shortness of breath, abdominal pain, nausea, vomiting, urinary symptoms, change in bowel habits.  States she is currently with 78-month-old infant at home been switching between breast-feeding and formula feeding.    Past medical history significant for gestational hypertension, preeclampsia  Home Medications Prior to Admission medications   Medication Sig Start Date End Date Taking? Authorizing Provider  pyridOXINE (VITAMIN B6) 25 MG tablet Take 1 tablet (25 mg total) by mouth every 6 (six) hours as needed. 06/11/22  Yes Sherian Maroon A, PA  acetaminophen (TYLENOL) 500 MG tablet Take 2 tablets (1,000 mg total) by mouth every 8 (eight) hours as needed (pain). 01/03/22   Worthy Rancher, MD  diclofenac Sodium (VOLTAREN) 1 % GEL Apply 2 g topically 4 (four) times daily as needed. 02/19/22   Long, Arlyss Repress, MD  furosemide (LASIX) 20 MG tablet Take 1 tablet (20 mg total) by mouth daily for 4 days. 01/04/22 01/08/22  Worthy Rancher, MD  ibuprofen  (ADVIL) 600 MG tablet Take 1 tablet (600 mg total) by mouth every 6 (six) hours as needed (pain). 01/03/22   Worthy Rancher, MD  norethindrone (MICRONOR) 0.35 MG tablet Take 1 tablet (0.35 mg total) by mouth daily. 02/13/22   Katrinka Blazing, [place], CNM  oxyCODONE (ROXICODONE) 5 MG immediate release tablet Take 1 tablet (5 mg total) by mouth every 6 (six) hours as needed for severe pain or breakthrough pain. Patient not taking: Reported on 01/09/2022 01/03/22   Worthy Rancher, MD      Allergies    Patient has no known allergies.    Review of Systems   Review of Systems  Neurological:  Positive for headaches.    Physical Exam Updated Vital Signs BP 120/74 (BP Location: Right Arm)   Pulse (!) 108   Temp 99 F (37.2 C) (Oral)   Resp 18   SpO2 100%  Physical Exam Vitals and nursing note reviewed.  Constitutional:      General: She is not in acute distress.    Appearance: She is well-developed.  HENT:     Head: Normocephalic and atraumatic.  Eyes:     Conjunctiva/sclera: Conjunctivae normal.  Cardiovascular:     Rate and Rhythm: Normal rate and regular rhythm.     Heart sounds: No murmur heard. Pulmonary:     Effort: Pulmonary effort is normal. No respiratory distress.     Breath sounds: Normal breath sounds.  Abdominal:     Palpations: Abdomen is soft.     Tenderness: There is no abdominal tenderness.  Musculoskeletal:  General: No swelling.     Cervical back: Neck supple.  Skin:    General: Skin is warm and dry.     Capillary Refill: Capillary refill takes less than 2 seconds.  Neurological:     Mental Status: She is alert.     Comments: Alert and oriented to self, place, time and event.   Speech is fluent, clear without dysarthria or dysphasia.   Strength 5/5 in upper/lower extremities   Sensation intact in upper/lower extremities   Normal gait.  Negative Romberg. No pronator drift.  Normal finger-to-nose and feet tapping.  CN I not tested  CN II grossly  intact visual fields bilaterally. Did not visualize posterior eye.  CN III, IV, VI PERRLA and EOMs intact bilaterally  CN V Intact sensation to sharp and light touch to the face  CN VII facial movements symmetric  CN VIII not tested  CN IX, X no uvula deviation, symmetric rise of soft palate  CN XI 5/5 SCM and trapezius strength bilaterally  CN XII Midline tongue protrusion, symmetric L/R movements   Psychiatric:        Mood and Affect: Mood normal.     ED Results / Procedures / Treatments   Labs (all labs ordered are listed, but only abnormal results are displayed) Labs Reviewed  RESP PANEL BY RT-PCR (RSV, FLU A&B, COVID)  [id number]    EKG None  Radiology No results found.  Procedures Procedures    Medications Ordered in ED Medications  sodium chloride 0.9 % bolus 500 mL (0 mLs Intravenous Stopped 06/11/22 1814)  ketorolac (TORADOL) 30 MG/ML injection 15 mg (15 mg Intravenous Given 06/11/22 1717)  metoCLOPramide (REGLAN) injection 10 mg (10 mg Intravenous Given 06/11/22 1717)    ED Course/ Medical Decision Making/ A&P                           Medical Decision Making Risk OTC drugs. Prescription drug management.   This patient presents to the ED for concern of headache, this involves an extensive number of treatment options, and is a complaint that carries with it a high risk of complications and morbidity.  The differential diagnosis includes CVA, electrolyte abnormalities, thrombosis, tension, cluster, migraine headache, meningitis, encephalitis, pseudotumor cerebri   Co morbidities that complicate the patient evaluation  See HPI   Additional history obtained:  Additional history obtained from EMR External records from outside source obtained and reviewed including hospital records   Lab Tests:  I Ordered, and personally interpreted labs.  The pertinent results include: Negative respiratory viral panel   Imaging Studies ordered:  N/a   Cardiac  Monitoring: / EKG:  The patient was maintained on a cardiac monitor.  I personally viewed and interpreted the cardiac monitored which showed an underlying rhythm of: Sinus rhythm   Consultations Obtained:  N/a   Problem List / ED Course / Critical interventions / Medication management  Headache I ordered medication including Toradol, Reglan, 500 cc of fluids   Reevaluation of the patient after these medicines showed that the patient improved I have reviewed the patients home medicines and have made adjustments as needed   Social Determinants of Health:  Currently breast-feeding.  Former cigarette use.  Denies illicit drug use.   Test / Admission - Considered:  Headache Vitals signs significant for initial tachycardia which decreased with fluids and medicines administered in the emergency department.. Otherwise within normal range and stable throughout visit. Laboratory/imaging studies significant for:  See above Patient presenting with migraine type headache with no acute neurodeficits.  Doubt CVA, meningitis, cerebral venous thrombosis.  Patient responded well to migraine cocktail administered while emergency department.  Patient educated regarding harmful side effects given current lactation status but she states that she could switch over to full formula for the next few days while excreted breastmilk is discarded.  After migraine cocktail, patient noted near resolution of symptoms.  Patient recommended follow-up with primary care for reevaluation of symptoms.  Again, patient educated regarding dumping breastmilk for the next 3 to 4 days and preference for formula feeding of infant as discussed.  Treatment plan discussed at length with patient and she acknowledged understanding was agreeable to said plan. Worrisome signs and symptoms were discussed with the patient, and the patient acknowledged understanding to return to the ED if noticed. Patient was stable upon discharge.           Final Clinical Impression(s) / ED Diagnoses Final diagnoses:  Migraine without status migrainosus, not intractable, unspecified migraine type    Rx / DC Orders ED Discharge Orders          Ordered    pyridOXINE (VITAMIN B6) 25 MG tablet  Every 6 hours PRN        06/11/22 1907              Peter Garter, [place] 06/11/22 1953    Rolan Bucco, MD 06/11/22 2308

## 2022-06-11 NOTE — ED Triage Notes (Signed)
Pt c/o HA since yesterday, associated nausea. States last 2 wks, cold-like symptoms.

## 2022-06-11 NOTE — Discharge Instructions (Addendum)
Note the workup today was overall reassuring.  As discussed, recommend not breast-feeding for the next 4 to 5 days with medicines given out of her system.  Recommend reevaluation by primary care for reassessment of your symptoms.  Please do not hesitate to return to emergency department for worrisome signs and symptoms we discussed become apparent.
